# Patient Record
Sex: Male | Born: 1975
Health system: Southern US, Community
[De-identification: ages and names within clinical notes are randomized; demographics above are authoritative.]

## PROBLEM LIST (undated history)

## (undated) DIAGNOSIS — Z86018 Personal history of other benign neoplasm: Secondary | ICD-10-CM

## (undated) HISTORY — DX: Personal history of other benign neoplasm: Z86.018

---

## 2002-06-22 ENCOUNTER — Emergency Department (HOSPITAL_COMMUNITY): Admission: EM | Admit: 2002-06-22 | Discharge: 2002-06-22 | Payer: Self-pay | Admitting: Emergency Medicine

## 2013-05-29 ENCOUNTER — Ambulatory Visit: Payer: Self-pay | Admitting: Internal Medicine

## 2014-09-18 ENCOUNTER — Other Ambulatory Visit: Payer: Self-pay | Admitting: Internal Medicine

## 2014-09-18 DIAGNOSIS — R911 Solitary pulmonary nodule: Secondary | ICD-10-CM

## 2014-09-23 ENCOUNTER — Ambulatory Visit
Admission: RE | Admit: 2014-09-23 | Discharge: 2014-09-23 | Disposition: A | Discharge status: Home / Self Care | Payer: BLUE CROSS/BLUE SHIELD | Source: Ambulatory Visit | Attending: Internal Medicine | Admitting: Internal Medicine

## 2014-09-23 DIAGNOSIS — R911 Solitary pulmonary nodule: Secondary | ICD-10-CM | POA: Diagnosis not present

## 2015-09-09 DIAGNOSIS — E7800 Pure hypercholesterolemia, unspecified: Secondary | ICD-10-CM | POA: Diagnosis not present

## 2015-09-09 DIAGNOSIS — Z79899 Other long term (current) drug therapy: Secondary | ICD-10-CM | POA: Diagnosis not present

## 2015-09-09 DIAGNOSIS — I10 Essential (primary) hypertension: Secondary | ICD-10-CM | POA: Diagnosis not present

## 2015-09-16 DIAGNOSIS — E7800 Pure hypercholesterolemia, unspecified: Secondary | ICD-10-CM | POA: Diagnosis not present

## 2015-09-16 DIAGNOSIS — F419 Anxiety disorder, unspecified: Secondary | ICD-10-CM | POA: Diagnosis not present

## 2015-09-16 DIAGNOSIS — Z0000 Encounter for general adult medical examination without abnormal findings: Secondary | ICD-10-CM | POA: Diagnosis not present

## 2015-09-16 DIAGNOSIS — I10 Essential (primary) hypertension: Secondary | ICD-10-CM | POA: Diagnosis not present

## 2015-12-06 DIAGNOSIS — H52223 Regular astigmatism, bilateral: Secondary | ICD-10-CM | POA: Diagnosis not present

## 2015-12-06 DIAGNOSIS — H5213 Myopia, bilateral: Secondary | ICD-10-CM | POA: Diagnosis not present

## 2016-02-22 DIAGNOSIS — M62830 Muscle spasm of back: Secondary | ICD-10-CM | POA: Diagnosis not present

## 2016-02-22 DIAGNOSIS — M9901 Segmental and somatic dysfunction of cervical region: Secondary | ICD-10-CM | POA: Diagnosis not present

## 2016-02-22 DIAGNOSIS — M4602 Spinal enthesopathy, cervical region: Secondary | ICD-10-CM | POA: Diagnosis not present

## 2016-02-24 DIAGNOSIS — M542 Cervicalgia: Secondary | ICD-10-CM | POA: Diagnosis not present

## 2016-02-24 DIAGNOSIS — M62830 Muscle spasm of back: Secondary | ICD-10-CM | POA: Diagnosis not present

## 2016-02-24 DIAGNOSIS — I10 Essential (primary) hypertension: Secondary | ICD-10-CM | POA: Diagnosis not present

## 2016-02-24 DIAGNOSIS — M9901 Segmental and somatic dysfunction of cervical region: Secondary | ICD-10-CM | POA: Diagnosis not present

## 2016-02-24 DIAGNOSIS — M4602 Spinal enthesopathy, cervical region: Secondary | ICD-10-CM | POA: Diagnosis not present

## 2016-02-28 DIAGNOSIS — M4602 Spinal enthesopathy, cervical region: Secondary | ICD-10-CM | POA: Diagnosis not present

## 2016-02-28 DIAGNOSIS — M62830 Muscle spasm of back: Secondary | ICD-10-CM | POA: Diagnosis not present

## 2016-02-28 DIAGNOSIS — M9901 Segmental and somatic dysfunction of cervical region: Secondary | ICD-10-CM | POA: Diagnosis not present

## 2016-03-08 DIAGNOSIS — Z125 Encounter for screening for malignant neoplasm of prostate: Secondary | ICD-10-CM | POA: Diagnosis not present

## 2016-03-08 DIAGNOSIS — Z79899 Other long term (current) drug therapy: Secondary | ICD-10-CM | POA: Diagnosis not present

## 2016-03-08 DIAGNOSIS — I10 Essential (primary) hypertension: Secondary | ICD-10-CM | POA: Diagnosis not present

## 2016-03-08 DIAGNOSIS — E7800 Pure hypercholesterolemia, unspecified: Secondary | ICD-10-CM | POA: Diagnosis not present

## 2016-03-15 DIAGNOSIS — E7800 Pure hypercholesterolemia, unspecified: Secondary | ICD-10-CM | POA: Diagnosis not present

## 2016-03-15 DIAGNOSIS — F419 Anxiety disorder, unspecified: Secondary | ICD-10-CM | POA: Diagnosis not present

## 2016-03-15 DIAGNOSIS — I10 Essential (primary) hypertension: Secondary | ICD-10-CM | POA: Diagnosis not present

## 2016-03-15 DIAGNOSIS — F3341 Major depressive disorder, recurrent, in partial remission: Secondary | ICD-10-CM | POA: Diagnosis not present

## 2016-06-15 DIAGNOSIS — M542 Cervicalgia: Secondary | ICD-10-CM | POA: Diagnosis not present

## 2016-06-15 DIAGNOSIS — M9901 Segmental and somatic dysfunction of cervical region: Secondary | ICD-10-CM | POA: Diagnosis not present

## 2016-06-15 DIAGNOSIS — M791 Myalgia: Secondary | ICD-10-CM | POA: Diagnosis not present

## 2016-09-12 DIAGNOSIS — Z79899 Other long term (current) drug therapy: Secondary | ICD-10-CM | POA: Diagnosis not present

## 2016-09-12 DIAGNOSIS — I10 Essential (primary) hypertension: Secondary | ICD-10-CM | POA: Diagnosis not present

## 2016-09-12 DIAGNOSIS — E7800 Pure hypercholesterolemia, unspecified: Secondary | ICD-10-CM | POA: Diagnosis not present

## 2016-09-18 DIAGNOSIS — I10 Essential (primary) hypertension: Secondary | ICD-10-CM | POA: Diagnosis not present

## 2016-09-18 DIAGNOSIS — E7800 Pure hypercholesterolemia, unspecified: Secondary | ICD-10-CM | POA: Diagnosis not present

## 2016-09-18 DIAGNOSIS — Z0000 Encounter for general adult medical examination without abnormal findings: Secondary | ICD-10-CM | POA: Diagnosis not present

## 2016-09-18 DIAGNOSIS — F419 Anxiety disorder, unspecified: Secondary | ICD-10-CM | POA: Diagnosis not present

## 2017-01-26 ENCOUNTER — Ambulatory Visit (INDEPENDENT_AMBULATORY_CARE_PROVIDER_SITE_OTHER): Payer: Self-pay | Admitting: Psychiatry

## 2017-01-26 ENCOUNTER — Encounter: Payer: Self-pay | Admitting: Psychiatry

## 2017-01-26 VITALS — BP 137/80 | HR 60 | Temp 97.8°F | Wt 210.0 lb

## 2017-01-26 DIAGNOSIS — J069 Acute upper respiratory infection, unspecified: Secondary | ICD-10-CM

## 2017-01-26 NOTE — Patient Instructions (Signed)
Cool Mist Vaporizer A cool mist vaporizer is a device that releases a cool mist into the air. If you have a cough or a cold, using a vaporizer may help relieve your symptoms. The mist adds moisture to the air, which may help thin your mucus and make it less sticky. When your mucus is thin and less sticky, it easier for you to breathe and to cough up secretions. Do not use a vaporizer if you are allergic to mold. Follow these instructions at home:  Follow the instructions that come with the vaporizer.  Do not use anything other than distilled water in the vaporizer.  Do not run the vaporizer all of the time. Doing that can cause mold or bacteria to grow in the vaporizer.  Clean the vaporizer after each time that you use it.  Clean and dry the vaporizer well before storing it.  Stop using the vaporizer if your breathing symptoms get worse. This information is not intended to replace advice given to you by your health care provider. Make sure you discuss any questions you have with your health care provider. Document Released: 09/23/2003 Document Revised: 07/16/2015 Document Reviewed: 03/27/2015 Elsevier Interactive Patient Education  2018 Elsevier Inc.  

## 2017-01-26 NOTE — Progress Notes (Signed)
  Subjective:     Stanlee Roehrig Prill is a 42 y.o. male who presents for evaluation of symptoms of a URI. Pt reports symptoms of common cold since yesterday, nasal congestion, sneezing, runmig nose. Sore throat lasted only one day, yesterday, but has now resolved. Was around a sick co-worker. Denies any cough, fever, chest pain,  bodyache, nausea or vomiting. Reports wife gave him sudafed and tylenol yesterday and that was effective.  The following portions of the patient's history were reviewed and updated as appropriate: allergies, current medications, past family history, past medical history, past social history, past surgical history and problem list.  Review of Systems A comprehensive review of systems was negative except for: as stated in HPI   Objective:    General appearance: alert Head: Normocephalic, without obvious abnormality, atraumatic Eyes: conjunctivae/corneas clear. PERRL, EOM's intact. Fundi benign. Ears: normal TM's and external ear canals both ears Nose: Nares normal. Septum midline. Mucosa normal. No drainage or sinus tenderness. Throat: lips, mucosa, and tongue normal; teeth and gums normal Neck: no adenopathy, no carotid bruit, no JVD, supple, symmetrical, trachea midline and thyroid not enlarged, symmetric, no tenderness/mass/nodules Lungs: clear to auscultation bilaterally Heart: regular rate and rhythm, S1, S2 normal, no murmur, click, rub or gallop   Assessment:    viral upper respiratory illness   Plan:   Recommended humidifier and OTC allergy medication. Discussed diagnosis and treatment of URI. Suggested symptomatic OTC remedies. Nasal saline spray for congestion. Follow up as needed.

## 2017-03-12 DIAGNOSIS — I10 Essential (primary) hypertension: Secondary | ICD-10-CM | POA: Diagnosis not present

## 2017-03-12 DIAGNOSIS — E7800 Pure hypercholesterolemia, unspecified: Secondary | ICD-10-CM | POA: Diagnosis not present

## 2017-03-12 DIAGNOSIS — Z79899 Other long term (current) drug therapy: Secondary | ICD-10-CM | POA: Diagnosis not present

## 2017-03-12 DIAGNOSIS — Z125 Encounter for screening for malignant neoplasm of prostate: Secondary | ICD-10-CM | POA: Diagnosis not present

## 2017-03-19 DIAGNOSIS — F3341 Major depressive disorder, recurrent, in partial remission: Secondary | ICD-10-CM | POA: Diagnosis not present

## 2017-03-19 DIAGNOSIS — E7800 Pure hypercholesterolemia, unspecified: Secondary | ICD-10-CM | POA: Diagnosis not present

## 2017-03-19 DIAGNOSIS — F410 Panic disorder [episodic paroxysmal anxiety] without agoraphobia: Secondary | ICD-10-CM | POA: Diagnosis not present

## 2017-03-19 DIAGNOSIS — I10 Essential (primary) hypertension: Secondary | ICD-10-CM | POA: Diagnosis not present

## 2017-06-06 DIAGNOSIS — Z86018 Personal history of other benign neoplasm: Secondary | ICD-10-CM

## 2017-06-06 DIAGNOSIS — L578 Other skin changes due to chronic exposure to nonionizing radiation: Secondary | ICD-10-CM | POA: Diagnosis not present

## 2017-06-06 DIAGNOSIS — Z1283 Encounter for screening for malignant neoplasm of skin: Secondary | ICD-10-CM | POA: Diagnosis not present

## 2017-06-06 DIAGNOSIS — D2262 Melanocytic nevi of left upper limb, including shoulder: Secondary | ICD-10-CM | POA: Diagnosis not present

## 2017-06-06 DIAGNOSIS — D229 Melanocytic nevi, unspecified: Secondary | ICD-10-CM | POA: Diagnosis not present

## 2017-06-06 DIAGNOSIS — D485 Neoplasm of uncertain behavior of skin: Secondary | ICD-10-CM | POA: Diagnosis not present

## 2017-06-06 HISTORY — DX: Personal history of other benign neoplasm: Z86.018

## 2017-08-20 DIAGNOSIS — M531 Cervicobrachial syndrome: Secondary | ICD-10-CM | POA: Diagnosis not present

## 2017-08-20 DIAGNOSIS — M9901 Segmental and somatic dysfunction of cervical region: Secondary | ICD-10-CM | POA: Diagnosis not present

## 2017-08-20 DIAGNOSIS — G5601 Carpal tunnel syndrome, right upper limb: Secondary | ICD-10-CM | POA: Diagnosis not present

## 2017-09-11 DIAGNOSIS — H5213 Myopia, bilateral: Secondary | ICD-10-CM | POA: Diagnosis not present

## 2017-09-11 DIAGNOSIS — H52223 Regular astigmatism, bilateral: Secondary | ICD-10-CM | POA: Diagnosis not present

## 2017-09-17 DIAGNOSIS — Z79899 Other long term (current) drug therapy: Secondary | ICD-10-CM | POA: Diagnosis not present

## 2017-09-17 DIAGNOSIS — E7800 Pure hypercholesterolemia, unspecified: Secondary | ICD-10-CM | POA: Diagnosis not present

## 2017-09-24 DIAGNOSIS — Z0000 Encounter for general adult medical examination without abnormal findings: Secondary | ICD-10-CM | POA: Diagnosis not present

## 2017-09-24 DIAGNOSIS — I10 Essential (primary) hypertension: Secondary | ICD-10-CM | POA: Diagnosis not present

## 2017-09-24 DIAGNOSIS — F419 Anxiety disorder, unspecified: Secondary | ICD-10-CM | POA: Diagnosis not present

## 2017-09-24 DIAGNOSIS — E7800 Pure hypercholesterolemia, unspecified: Secondary | ICD-10-CM | POA: Diagnosis not present

## 2017-10-16 DIAGNOSIS — D225 Melanocytic nevi of trunk: Secondary | ICD-10-CM | POA: Diagnosis not present

## 2017-10-16 DIAGNOSIS — Z1283 Encounter for screening for malignant neoplasm of skin: Secondary | ICD-10-CM | POA: Diagnosis not present

## 2017-10-16 DIAGNOSIS — L918 Other hypertrophic disorders of the skin: Secondary | ICD-10-CM | POA: Diagnosis not present

## 2017-10-16 DIAGNOSIS — L814 Other melanin hyperpigmentation: Secondary | ICD-10-CM | POA: Diagnosis not present

## 2017-10-16 DIAGNOSIS — L578 Other skin changes due to chronic exposure to nonionizing radiation: Secondary | ICD-10-CM | POA: Diagnosis not present

## 2017-10-16 DIAGNOSIS — L988 Other specified disorders of the skin and subcutaneous tissue: Secondary | ICD-10-CM | POA: Diagnosis not present

## 2017-10-16 DIAGNOSIS — D485 Neoplasm of uncertain behavior of skin: Secondary | ICD-10-CM | POA: Diagnosis not present

## 2017-10-16 DIAGNOSIS — D1800 Hemangioma unspecified site: Secondary | ICD-10-CM | POA: Diagnosis not present

## 2017-10-23 DIAGNOSIS — D485 Neoplasm of uncertain behavior of skin: Secondary | ICD-10-CM | POA: Diagnosis not present

## 2017-10-23 DIAGNOSIS — Z86018 Personal history of other benign neoplasm: Secondary | ICD-10-CM

## 2017-10-23 DIAGNOSIS — D225 Melanocytic nevi of trunk: Secondary | ICD-10-CM | POA: Diagnosis not present

## 2017-10-23 HISTORY — DX: Personal history of other benign neoplasm: Z86.018

## 2018-03-18 DIAGNOSIS — Z125 Encounter for screening for malignant neoplasm of prostate: Secondary | ICD-10-CM | POA: Diagnosis not present

## 2018-03-18 DIAGNOSIS — E7800 Pure hypercholesterolemia, unspecified: Secondary | ICD-10-CM | POA: Diagnosis not present

## 2018-03-18 DIAGNOSIS — I10 Essential (primary) hypertension: Secondary | ICD-10-CM | POA: Diagnosis not present

## 2018-03-18 DIAGNOSIS — Z79899 Other long term (current) drug therapy: Secondary | ICD-10-CM | POA: Diagnosis not present

## 2018-03-25 DIAGNOSIS — I10 Essential (primary) hypertension: Secondary | ICD-10-CM | POA: Diagnosis not present

## 2018-03-25 DIAGNOSIS — Z79899 Other long term (current) drug therapy: Secondary | ICD-10-CM | POA: Diagnosis not present

## 2018-03-25 DIAGNOSIS — E7800 Pure hypercholesterolemia, unspecified: Secondary | ICD-10-CM | POA: Diagnosis not present

## 2018-03-25 DIAGNOSIS — F419 Anxiety disorder, unspecified: Secondary | ICD-10-CM | POA: Diagnosis not present

## 2018-04-09 MED FILL — SERTRALINE HCL 100 MG TAB: 100 | 90 days supply | Qty: 90 | Fill #0

## 2018-05-11 MED FILL — PRAVASTATIN NA 40 MG TAB: 40 | 90 days supply | Qty: 90 | Fill #0

## 2018-09-02 ENCOUNTER — Other Ambulatory Visit: Payer: Self-pay

## 2018-09-02 DIAGNOSIS — Z20822 Contact with and (suspected) exposure to covid-19: Secondary | ICD-10-CM

## 2018-09-03 LAB — NOVEL CORONAVIRUS, NAA: SARS-CoV-2, NAA: NOT DETECTED

## 2018-09-23 DIAGNOSIS — Z79899 Other long term (current) drug therapy: Secondary | ICD-10-CM | POA: Diagnosis not present

## 2018-09-23 DIAGNOSIS — E7800 Pure hypercholesterolemia, unspecified: Secondary | ICD-10-CM | POA: Diagnosis not present

## 2018-09-30 DIAGNOSIS — E7800 Pure hypercholesterolemia, unspecified: Secondary | ICD-10-CM | POA: Diagnosis not present

## 2018-09-30 DIAGNOSIS — Z23 Encounter for immunization: Secondary | ICD-10-CM | POA: Diagnosis not present

## 2018-09-30 DIAGNOSIS — Z0000 Encounter for general adult medical examination without abnormal findings: Secondary | ICD-10-CM | POA: Diagnosis not present

## 2018-09-30 DIAGNOSIS — I10 Essential (primary) hypertension: Secondary | ICD-10-CM | POA: Diagnosis not present

## 2018-10-14 ENCOUNTER — Other Ambulatory Visit: Payer: Self-pay

## 2018-10-14 DIAGNOSIS — Z20822 Contact with and (suspected) exposure to covid-19: Secondary | ICD-10-CM

## 2018-10-16 LAB — NOVEL CORONAVIRUS, NAA: SARS-CoV-2, NAA: NOT DETECTED

## 2019-03-22 ENCOUNTER — Ambulatory Visit: Payer: Self-pay | Attending: Internal Medicine

## 2019-03-22 DIAGNOSIS — Z23 Encounter for immunization: Secondary | ICD-10-CM

## 2019-03-22 NOTE — Progress Notes (Signed)
   Covid-19 Vaccination Clinic  Name:  Tyler Kennedy    MRN: [id number] DOB: 25-Jul-1975  03/22/2019  [name] was observed post Covid-19 immunization for 15 minutes without incident. He was provided with Vaccine Information Sheet and instruction to access the V-Safe system.   [name] was instructed to call 911 with any severe reactions post vaccine: Marland Kitchen Difficulty breathing  . Swelling of face and throat  . A fast heartbeat  . A bad rash all over body  . Dizziness and weakness   Immunizations Administered    Name Date Dose VIS Date Route   Pfizer COVID-19 Vaccine 03/22/2019  2:05 PM 0.3 mL 12/20/2018 Intramuscular   Manufacturer: [place]   Lot: [id number]   [place]: [id number]

## 2019-03-24 DIAGNOSIS — E7800 Pure hypercholesterolemia, unspecified: Secondary | ICD-10-CM | POA: Diagnosis not present

## 2019-03-24 DIAGNOSIS — I10 Essential (primary) hypertension: Secondary | ICD-10-CM | POA: Diagnosis not present

## 2019-03-24 DIAGNOSIS — Z125 Encounter for screening for malignant neoplasm of prostate: Secondary | ICD-10-CM | POA: Diagnosis not present

## 2019-03-24 DIAGNOSIS — Z79899 Other long term (current) drug therapy: Secondary | ICD-10-CM | POA: Diagnosis not present

## 2019-03-31 ENCOUNTER — Other Ambulatory Visit: Payer: Self-pay | Admitting: Internal Medicine

## 2019-03-31 DIAGNOSIS — F410 Panic disorder [episodic paroxysmal anxiety] without agoraphobia: Secondary | ICD-10-CM | POA: Diagnosis not present

## 2019-03-31 DIAGNOSIS — E7800 Pure hypercholesterolemia, unspecified: Secondary | ICD-10-CM | POA: Diagnosis not present

## 2019-03-31 DIAGNOSIS — F3341 Major depressive disorder, recurrent, in partial remission: Secondary | ICD-10-CM | POA: Diagnosis not present

## 2019-03-31 DIAGNOSIS — I10 Essential (primary) hypertension: Secondary | ICD-10-CM | POA: Diagnosis not present

## 2019-04-16 ENCOUNTER — Ambulatory Visit: Payer: Self-pay | Attending: Internal Medicine

## 2019-04-16 DIAGNOSIS — Z23 Encounter for immunization: Secondary | ICD-10-CM

## 2019-04-16 NOTE — Progress Notes (Signed)
   Covid-19 Vaccination Clinic  Name:  Tyler Kennedy    MRN: [id number] DOB: 04-21-1975  04/16/2019  [name] was observed post Covid-19 immunization for 15 minutes without incident. He was provided with Vaccine Information Sheet and instruction to access the V-Safe system.   [name] was instructed to call 911 with any severe reactions post vaccine: Marland Kitchen Difficulty breathing  . Swelling of face and throat  . A fast heartbeat  . A bad rash all over body  . Dizziness and weakness   Immunizations Administered    Name Date Dose VIS Date Route   Pfizer COVID-19 Vaccine 04/16/2019 11:10 AM 0.3 mL 12/20/2018 Intramuscular   Manufacturer: [place]   Lot: [id number]   [place]: [id number]

## 2019-07-02 DIAGNOSIS — M531 Cervicobrachial syndrome: Secondary | ICD-10-CM | POA: Diagnosis not present

## 2019-07-02 DIAGNOSIS — M9901 Segmental and somatic dysfunction of cervical region: Secondary | ICD-10-CM | POA: Diagnosis not present

## 2019-07-02 DIAGNOSIS — G5601 Carpal tunnel syndrome, right upper limb: Secondary | ICD-10-CM | POA: Diagnosis not present

## 2019-08-05 DIAGNOSIS — Z20822 Contact with and (suspected) exposure to covid-19: Secondary | ICD-10-CM | POA: Diagnosis not present

## 2019-08-28 ENCOUNTER — Encounter: Payer: Self-pay | Admitting: Dermatology

## 2019-09-02 DIAGNOSIS — H5213 Myopia, bilateral: Secondary | ICD-10-CM | POA: Diagnosis not present

## 2019-10-02 ENCOUNTER — Other Ambulatory Visit: Payer: Self-pay

## 2019-10-02 ENCOUNTER — Ambulatory Visit (INDEPENDENT_AMBULATORY_CARE_PROVIDER_SITE_OTHER): Payer: 59 | Admitting: Dermatology

## 2019-10-02 ENCOUNTER — Encounter: Payer: Self-pay | Admitting: Dermatology

## 2019-10-02 DIAGNOSIS — D1800 Hemangioma unspecified site: Secondary | ICD-10-CM

## 2019-10-02 DIAGNOSIS — D229 Melanocytic nevi, unspecified: Secondary | ICD-10-CM

## 2019-10-02 DIAGNOSIS — L821 Other seborrheic keratosis: Secondary | ICD-10-CM

## 2019-10-02 DIAGNOSIS — L578 Other skin changes due to chronic exposure to nonionizing radiation: Secondary | ICD-10-CM

## 2019-10-02 DIAGNOSIS — Z1283 Encounter for screening for malignant neoplasm of skin: Secondary | ICD-10-CM

## 2019-10-02 DIAGNOSIS — L719 Rosacea, unspecified: Secondary | ICD-10-CM | POA: Diagnosis not present

## 2019-10-02 DIAGNOSIS — Z86018 Personal history of other benign neoplasm: Secondary | ICD-10-CM | POA: Diagnosis not present

## 2019-10-02 DIAGNOSIS — L814 Other melanin hyperpigmentation: Secondary | ICD-10-CM

## 2019-10-02 NOTE — Progress Notes (Signed)
   Follow-Up Visit   Subjective  Tyler Kennedy is a 44 y.o. male who presents for the following: Annual Exam (Pt present for UBSE, hx of Dysplastic nevus ).   The patient presents for Upper Body Skin Exam (UBSE) for skin cancer screening and mole check.  The following portions of the chart were reviewed this encounter and updated as appropriate:  Allergies  Meds  Problems  Med Hx  Surg Hx  Fam Hx     Review of Systems:  No other skin or systemic complaints except as noted in HPI or Assessment and Plan.  Objective  Well appearing patient in no apparent distress; mood and affect are within normal limits.  All skin waist up examined.  Objective  Head - Anterior (Face): Mid face erythema with telangiectasias +/- scattered inflammatory papules.   Objective  multiple see history: Scar with no evidence of recurrence.    Assessment & Plan  Rosacea Head - Anterior (Face) Observe   History of dysplastic nevus multiple see history Clear. Observe for recurrence. Call clinic for new or changing lesions.  Recommend regular skin exams, daily broad-spectrum spf 30+ sunscreen use, and photoprotection.      Lentigines - Scattered tan macules - Discussed due to sun exposure - Benign, observe - Call for any changes  Seborrheic Keratoses - Stuck-on, waxy, tan-brown papules and plaques  - Discussed benign etiology and prognosis. - Observe - Call for any changes  Melanocytic Nevi - Tan-brown and/or pink-flesh-colored symmetric macules and papules - Benign appearing on exam today - Observation - Call clinic for new or changing moles - Recommend daily use of broad spectrum spf 30+ sunscreen to sun-exposed areas.   Hemangiomas - Red papules - Discussed benign nature - Observe - Call for any changes  Actinic Damage - diffuse scaly erythematous macules with underlying dyspigmentation - Recommend daily broad spectrum sunscreen SPF 30+ to sun-exposed areas, reapply every 2 hours  as needed.  - Call for new or changing lesions.  Skin cancer screening performed today.  Return in about 1 year (around 10/01/2020) for TBSE.  IMarye Round, CMA, am acting as scribe for Sarina Ser, MD .  Documentation: I have reviewed the above documentation for accuracy and completeness, and I agree with the above.  Sarina Ser, MD

## 2019-10-03 ENCOUNTER — Encounter: Payer: Self-pay | Admitting: Dermatology

## 2019-11-10 DIAGNOSIS — Z79899 Other long term (current) drug therapy: Secondary | ICD-10-CM | POA: Diagnosis not present

## 2019-11-10 DIAGNOSIS — E7800 Pure hypercholesterolemia, unspecified: Secondary | ICD-10-CM | POA: Diagnosis not present

## 2019-11-17 ENCOUNTER — Other Ambulatory Visit: Payer: Self-pay | Admitting: Internal Medicine

## 2019-11-17 DIAGNOSIS — Z0000 Encounter for general adult medical examination without abnormal findings: Secondary | ICD-10-CM | POA: Diagnosis not present

## 2019-11-17 DIAGNOSIS — I10 Essential (primary) hypertension: Secondary | ICD-10-CM | POA: Diagnosis not present

## 2019-11-17 DIAGNOSIS — E7800 Pure hypercholesterolemia, unspecified: Secondary | ICD-10-CM | POA: Diagnosis not present

## 2019-11-17 DIAGNOSIS — Z720 Tobacco use: Secondary | ICD-10-CM | POA: Diagnosis not present

## 2019-11-17 DIAGNOSIS — F419 Anxiety disorder, unspecified: Secondary | ICD-10-CM | POA: Diagnosis not present

## 2020-01-10 ENCOUNTER — Emergency Department
Admission: EM | Admit: 2020-01-10 | Discharge: 2020-01-11 | Disposition: A | Discharge status: Home / Self Care | Payer: 59 | Attending: Emergency Medicine | Admitting: Emergency Medicine

## 2020-01-10 ENCOUNTER — Emergency Department: Payer: 59

## 2020-01-10 ENCOUNTER — Encounter: Payer: Self-pay | Admitting: Emergency Medicine

## 2020-01-10 DIAGNOSIS — S62102A Fracture of unspecified carpal bone, left wrist, initial encounter for closed fracture: Secondary | ICD-10-CM | POA: Diagnosis not present

## 2020-01-10 DIAGNOSIS — S52502A Unspecified fracture of the lower end of left radius, initial encounter for closed fracture: Secondary | ICD-10-CM | POA: Insufficient documentation

## 2020-01-10 DIAGNOSIS — S52612A Displaced fracture of left ulna styloid process, initial encounter for closed fracture: Secondary | ICD-10-CM | POA: Insufficient documentation

## 2020-01-10 DIAGNOSIS — S52592A Other fractures of lower end of left radius, initial encounter for closed fracture: Secondary | ICD-10-CM | POA: Diagnosis not present

## 2020-01-10 DIAGNOSIS — S6992XA Unspecified injury of left wrist, hand and finger(s), initial encounter: Secondary | ICD-10-CM | POA: Diagnosis present

## 2020-01-10 DIAGNOSIS — M7989 Other specified soft tissue disorders: Secondary | ICD-10-CM | POA: Diagnosis not present

## 2020-01-10 DIAGNOSIS — W19XXXA Unspecified fall, initial encounter: Secondary | ICD-10-CM

## 2020-01-10 DIAGNOSIS — S52572A Other intraarticular fracture of lower end of left radius, initial encounter for closed fracture: Secondary | ICD-10-CM | POA: Diagnosis not present

## 2020-01-10 MED ORDER — MORPHINE SULFATE (PF) 4 MG/ML IV SOLN
4.0000 mg | Freq: Once | INTRAVENOUS | Status: AC
  Administered 2020-01-11: 4 mg via INTRAVENOUS
  Filled 2020-01-10: qty 1

## 2020-01-10 MED ORDER — ONDANSETRON HCL 4 MG/2ML IJ SOLN
4.0000 mg | Freq: Once | INTRAMUSCULAR | Status: AC
  Administered 2020-01-11: 4 mg via INTRAVENOUS
  Filled 2020-01-10: qty 2

## 2020-01-10 MED ORDER — OXYCODONE-ACETAMINOPHEN 5-325 MG PO TABS
1.0000 | ORAL_TABLET | Freq: Once | ORAL | Status: AC
  Administered 2020-01-10: 1 via ORAL
  Filled 2020-01-10: qty 1

## 2020-01-10 MED ORDER — LIDOCAINE HCL (PF) 1 % IJ SOLN
10.0000 mL | Freq: Once | INTRAMUSCULAR | Status: AC
  Administered 2020-01-11: 10 mL
  Filled 2020-01-10: qty 10

## 2020-01-10 NOTE — ED Notes (Signed)
Patient informed that he will be transferred to a room on main side. L arm elevated and ice pack placed over top and underneath wrist. Patient and wife state understanding and are agreeable to move rooms.

## 2020-01-10 NOTE — ED Triage Notes (Signed)
Pt reports he fell off a [employer] and caught self with left hand. Obvious deformity to left wrist.

## 2020-01-11 ENCOUNTER — Emergency Department: Payer: 59

## 2020-01-11 DIAGNOSIS — S62102A Fracture of unspecified carpal bone, left wrist, initial encounter for closed fracture: Secondary | ICD-10-CM | POA: Diagnosis not present

## 2020-01-11 DIAGNOSIS — S52502A Unspecified fracture of the lower end of left radius, initial encounter for closed fracture: Secondary | ICD-10-CM | POA: Diagnosis not present

## 2020-01-11 DIAGNOSIS — S52592A Other fractures of lower end of left radius, initial encounter for closed fracture: Secondary | ICD-10-CM | POA: Diagnosis not present

## 2020-01-11 DIAGNOSIS — S52612A Displaced fracture of left ulna styloid process, initial encounter for closed fracture: Secondary | ICD-10-CM | POA: Diagnosis not present

## 2020-01-11 MED ORDER — OXYCODONE-ACETAMINOPHEN 5-325 MG PO TABS: 1.0000 | ORAL_TABLET | ORAL | 0 refills | Status: DC | PRN

## 2020-01-11 MED ORDER — MORPHINE SULFATE (PF) 4 MG/ML IV SOLN
4.0000 mg | Freq: Once | INTRAVENOUS | Status: AC
  Administered 2020-01-11: 4 mg via INTRAVENOUS
  Filled 2020-01-11: qty 1

## 2020-01-11 NOTE — ED Provider Notes (Signed)
[facility] Emergency Department Provider Note  ____________________________________________  Time seen: Approximately 2:05 AM  I have reviewed the triage vital signs and the nursing notes.   HISTORY  Chief Complaint Fall   HPI Tyler Kennedy is a 45 y.o. male left-handed dominant who presents for evaluation of left wrist pain.  Patient reports that he fell off a [employer] and caught himself with the left hand.  Has an obvious deformity to the left wrist.  Is complaining of severe constant throbbing sharp pain since the fall.  Patient denies any other injuries from the fall.   Past Medical History:  Diagnosis Date  . History of dysplastic nevus 06/06/2017   L scapula, moderate to severe atypia, excised 10/16/2017  . Hx of dysplastic nevus 10/23/2017   R mid back lateral 11cm lat to spine, mild atypia  . Hx of dysplastic nevus 10/23/2017   R low back lat flank, 15.0cm lat to spine, mild atypia    There are no problems to display for this patient.   History reviewed. No pertinent surgical history.  Prior to Admission medications   Medication Sig Start Date End Date Taking? Authorizing Provider  oxyCODONE-acetaminophen (PERCOCET) 5-325 MG tablet Take 1 tablet by mouth every 4 (four) hours as needed. 01/11/20  Yes Don Perking, [place], MD  pravastatin (PRAVACHOL) 40 MG tablet TAKE 1 TABLET (40 MG TOTAL) BY MOUTH NIGHTLY. 09/19/14   [provider]  sertraline (ZOLOFT) 100 MG tablet TAKE 1 TABLET BY MOUTH AT BEDTIME 07/09/14   [provider]    Allergies Patient has no known allergies.  History reviewed. No pertinent family history.  Social History Social History   Tobacco Use  . Smoking status: Never Smoker  . Smokeless tobacco: Never Used    Review of Systems  Constitutional: Negative for fever. Eyes: Negative for visual changes. ENT: Negative for facial injury or neck injury Cardiovascular: Negative for chest  injury. Respiratory: Negative for shortness of breath. Negative for chest wall injury. Gastrointestinal: Negative for abdominal pain or injury. Genitourinary: Negative for dysuria. Musculoskeletal: Negative for back injury, + L wrist pain Skin: Negative for laceration/abrasions. Neurological: Negative for head injury.   ____________________________________________   PHYSICAL EXAM:  VITAL SIGNS: ED Triage Vitals  Enc Vitals Group     BP 01/10/20 2155 128/79     Pulse Rate 01/10/20 2155 81     Resp 01/10/20 2155 18     Temp 01/10/20 2155 98.1 F (36.7 C)     Temp Source 01/10/20 2155 Oral     SpO2 01/10/20 2155 98 %     Weight 01/10/20 2158 210 lb (95.3 kg)     Height 01/10/20 2158 5\' 10"  (1.778 m)     Head Circumference --      Peak Flow --      Pain Score 01/10/20 2318 7     Pain Loc --      Pain Edu? --      Excl. in GC? --      Constitutional: Alert and oriented. No acute distress. Does not appear intoxicated. HEENT Head: Normocephalic and atraumatic. Face: No facial bony tenderness.  Eyes: No eye injury. PERRL. No raccoon eyes Nose: Nontender.  Mouth/Throat: Mucous membranes are moist. No oropharyngeal blood. No dental injury. Airway patent without stridor. Normal voice. Neck: no C-collar. No midline c-spine tenderness.  Cardiovascular: Normal rate, regular rhythm. Normal and symmetric distal pulses are present in all extremities. Pulmonary/Chest: Chest wall is stable  and nontender to palpation/compression. Normal respiratory effort. Breath sounds are normal. No crepitus.  Abdominal: Soft, nontender, non distended. Musculoskeletal: Obvious closed deformity of the L wrist. Strong distal pulses and normal neurological exam. Nontender with normal full range of motion in all other extremities. No thoracic or lumbar midline spinal tenderness. Pelvis is stable. Skin: Skin is warm, dry and intact. No abrasions or contutions. Psychiatric: Speech and behavior are  appropriate. Neurological: Normal speech and language. Moves all extremities to command. No gross focal neurologic deficits are appreciated.  Glascow Coma Score: 4 - Opens eyes on own 6 - Follows simple motor commands 5 - Alert and oriented GCS: 15   ____________________________________________   LABS (all labs ordered are listed, but only abnormal results are displayed)  Labs Reviewed - No data to display ____________________________________________  EKG  none  ____________________________________________  RADIOLOGY  I have personally reviewed the images performed during this visit and I agree with the Radiologist's read.   Interpretation by Radiologist:  DG Forearm Left  Result Date: 01/10/2020 CLINICAL DATA:  Fall EXAM: LEFT FOREARM - 2 VIEW COMPARISON:  None. FINDINGS: Acute displaced ulnar styloid process fracture. Acute comminuted intra-articular distal radius fracture with displacement and angulation. Proximal radius and ulna appear intact. No significant elbow effusion. Radial head alignment within normal limits IMPRESSION: 1. Acute comminuted, displaced and angulated intra-articular distal radius fracture. 2. Acute displaced ulnar styloid process fracture. Electronically Signed   By: Donavan Foil M.D.   On: 01/10/2020 22:45   DG Wrist Complete Left  Result Date: 01/11/2020 CLINICAL DATA:  Left wrist fracture, post reduction imaging EXAM: LEFT WRIST - COMPLETE 3+ VIEW COMPARISON:  10:17 p.m. FINDINGS: A a comminuted fracture of the distal radius is again identified. Dominant fracture plane is seen transversely within the metadiaphyseal region with persistentl roughly 6-7 mm override and moderate dorsal angulation of the distal fracture fragment. Longitudinal fracture planes extend into the scaphoid fossa with residual 2 mm gap within the radial articular surface. Unchanged mild loss of radial inclination. Radiocarpal articulation has been preserved. IMPRESSION: Interval  reduction of comminuted left wrist fracture as described above. Electronically Signed   By: Fidela Salisbury MD   On: 01/11/2020 01:57   DG Wrist Complete Left  Result Date: 01/10/2020 CLINICAL DATA:  Fall EXAM: LEFT WRIST - COMPLETE 3+ VIEW COMPARISON:  None. FINDINGS: Acute displaced ulnar styloid process fracture. Acute markedly comminuted intra-articular distal radius fracture with moderate dorsal angulation of distal fracture fragment and close to 1/2 shaft diameter dorsal displacement. Positive for soft tissue swelling IMPRESSION: 1. Acute markedly comminuted intra-articular distal radius fracture with dorsal angulation and displacement. 2. Acute displaced ulnar styloid process fracture. Electronically Signed   By: Donavan Foil M.D.   On: 01/10/2020 22:44   DG Hand Complete Left  Result Date: 01/10/2020 CLINICAL DATA:  Fall EXAM: LEFT HAND - COMPLETE 3+ VIEW COMPARISON:  None. FINDINGS: No acute displaced fracture or malalignment within the hand. Acute comminuted, displaced intra-articular distal radius fracture. IMPRESSION: Acute comminuted, displaced intra-articular distal radius fracture. Electronically Signed   By: Donavan Foil M.D.   On: 01/10/2020 22:43     ____________________________________________   PROCEDURES  Procedure(s) performed:yes .Ortho Injury Treatment  Date/Time: 01/11/2020 2:07 AM Performed by: Rudene Re, MD Authorized by: Rudene Re, MD   Consent:    Consent obtained:  Verbal   Consent given by:  Patient   Risks discussed:  Fracture, nerve damage, restricted joint movement, vascular damage, stiffness, recurrent dislocation and irreducible dislocation  Alternatives discussed:  ImmobilizationInjury location: wrist Location details: left wrist Injury type: fracture-dislocation Pre-procedure neurovascular assessment: neurovascularly intact Pre-procedure distal perfusion: normal Pre-procedure neurological function: normal Pre-procedure range of  motion: reduced Anesthesia: hematoma block  Anesthesia: Local anesthesia used: yes Local Anesthetic: lidocaine 1% without epinephrine Anesthetic total: 10 mL  Patient sedated: NoManipulation performed: yes Skeletal traction used: yes X-ray confirmed reduction: yes Immobilization: splint Splint type: sugar tong Supplies used: Ortho-Glass Post-procedure neurovascular assessment: post-procedure neurovascularly intact Post-procedure distal perfusion: normal Post-procedure neurological function: normal Post-procedure range of motion: unchanged Patient tolerance: patient tolerated the procedure well with no immediate complications    Critical Care performed:  None ____________________________________________   INITIAL IMPRESSION / ASSESSMENT AND PLAN / ED COURSE   45 y.o. male left-handed dominant who presents for evaluation of left wrist pain after falling off a [employer].  Patient with close deformity of the left wrist.  X-ray visualized by me showing a comminuted distal radius fracture and ulnar styloid fracture, confirmed by radiology. Hematoma block was done and limb was hung with finger traps for 40 min with the assistance of weights to help with reduction. Manipulation showed an impacted fracture. Minimal improvement on post reduction film. No signs of compartment syndrome or neurovascular involvement. Patient placed on splint and will be referred to ortho for close follow up. Message sent to Dr. Roland Rack for expedite follow up. Discussed signs and symptoms of compartment syndrome and recommended return if these develop.  Also discussed home care of splint.  History gathered from patient and his wife was at bedside, plan discussed with both of them.    ____________________________________________  Please note:  Patient was evaluated in Emergency Department today for the symptoms described in the history of present illness. Patient was evaluated in the context of the global COVID-19  pandemic, which necessitated consideration that the patient might be at risk for infection with the SARS-CoV-2 virus that causes COVID-19. Institutional protocols and algorithms that pertain to the evaluation of patients at risk for COVID-19 are in a state of rapid change based on information released by regulatory bodies including the CDC and federal and state organizations. These policies and algorithms were followed during the patient's care in the ED.  Some ED evaluations and interventions may be delayed as a result of limited staffing during the pandemic.   ____________________________________________   FINAL CLINICAL IMPRESSION(S) / ED DIAGNOSES   Final diagnoses:  Fall, initial encounter  Closed fracture of distal end of left radius, unspecified fracture morphology, initial encounter  Closed displaced fracture of styloid process of left ulna, initial encounter      NEW MEDICATIONS STARTED DURING THIS VISIT:  ED Discharge Orders         Ordered    oxyCODONE-acetaminophen (PERCOCET) 5-325 MG tablet  Every 4 hours PRN        01/11/20 0217           Note:  This document was prepared using Dragon voice recognition software and may include unintentional dictation errors.    Alfred Levins, [place], MD 01/11/20 [phone]

## 2020-01-12 ENCOUNTER — Other Ambulatory Visit: Payer: Self-pay | Admitting: Surgery

## 2020-01-12 ENCOUNTER — Other Ambulatory Visit
Admission: RE | Admit: 2020-01-12 | Discharge: 2020-01-12 | Disposition: A | Discharge status: Home / Self Care | Payer: 59 | Source: Ambulatory Visit | Attending: Surgery | Admitting: Surgery

## 2020-01-12 DIAGNOSIS — Z01812 Encounter for preprocedural laboratory examination: Secondary | ICD-10-CM | POA: Insufficient documentation

## 2020-01-12 DIAGNOSIS — Z20822 Contact with and (suspected) exposure to covid-19: Secondary | ICD-10-CM | POA: Diagnosis not present

## 2020-01-12 DIAGNOSIS — S52532A Colles' fracture of left radius, initial encounter for closed fracture: Secondary | ICD-10-CM | POA: Diagnosis not present

## 2020-01-13 ENCOUNTER — Encounter
Admission: RE | Disposition: A | Discharge status: Home / Self Care | Payer: Self-pay | Source: Home / Self Care | Attending: Surgery

## 2020-01-13 ENCOUNTER — Ambulatory Visit: Payer: 59 | Admitting: Certified Registered Nurse Anesthetist

## 2020-01-13 ENCOUNTER — Ambulatory Visit
Admission: RE | Admit: 2020-01-13 | Discharge: 2020-01-13 | Disposition: A | Discharge status: Home / Self Care | Payer: 59 | Attending: Surgery | Admitting: Surgery

## 2020-01-13 ENCOUNTER — Other Ambulatory Visit: Payer: Self-pay | Admitting: Surgery

## 2020-01-13 ENCOUNTER — Encounter: Payer: Self-pay | Admitting: Surgery

## 2020-01-13 ENCOUNTER — Other Ambulatory Visit: Payer: Self-pay

## 2020-01-13 ENCOUNTER — Ambulatory Visit: Payer: 59

## 2020-01-13 DIAGNOSIS — Z79899 Other long term (current) drug therapy: Secondary | ICD-10-CM | POA: Diagnosis not present

## 2020-01-13 DIAGNOSIS — S52572A Other intraarticular fracture of lower end of left radius, initial encounter for closed fracture: Secondary | ICD-10-CM | POA: Diagnosis not present

## 2020-01-13 DIAGNOSIS — Z87891 Personal history of nicotine dependence: Secondary | ICD-10-CM | POA: Diagnosis not present

## 2020-01-13 DIAGNOSIS — S52532A Colles' fracture of left radius, initial encounter for closed fracture: Secondary | ICD-10-CM | POA: Diagnosis not present

## 2020-01-13 HISTORY — PX: ORIF WRIST FRACTURE: SHX2133

## 2020-01-13 LAB — SARS CORONAVIRUS 2 (TAT 6-24 HRS): SARS Coronavirus 2: NEGATIVE

## 2020-01-13 SURGERY — OPEN REDUCTION INTERNAL FIXATION (ORIF) WRIST FRACTURE
Anesthesia: Choice | Site: Wrist | Laterality: Left

## 2020-01-13 MED ORDER — LIDOCAINE HCL (PF) 2 % IJ SOLN
INTRAMUSCULAR | Status: AC
  Filled 2020-01-13: qty 5

## 2020-01-13 MED ORDER — FENTANYL CITRATE (PF) 100 MCG/2ML IJ SOLN
INTRAMUSCULAR | Status: AC
  Filled 2020-01-13: qty 2

## 2020-01-13 MED ORDER — ACETAMINOPHEN 160 MG/5ML PO SOLN
325.0000 mg | ORAL | Status: DC | PRN
Start: 2020-01-13 — End: 2020-01-13
  Filled 2020-01-13: qty 20.3

## 2020-01-13 MED ORDER — PROPOFOL 10 MG/ML IV BOLUS
INTRAVENOUS | Status: AC
  Filled 2020-01-13: qty 40

## 2020-01-13 MED ORDER — KETOROLAC TROMETHAMINE 30 MG/ML IJ SOLN
INTRAMUSCULAR | Status: DC | PRN
  Administered 2020-01-13: 30 mg via INTRAVENOUS

## 2020-01-13 MED ORDER — MIDAZOLAM HCL 2 MG/2ML IJ SOLN
INTRAMUSCULAR | Status: DC | PRN
  Administered 2020-01-13: 2 mg via INTRAVENOUS

## 2020-01-13 MED ORDER — CHLORHEXIDINE GLUCONATE 0.12 % MT SOLN
OROMUCOSAL | Status: AC
  Administered 2020-01-13: 15 mL via OROMUCOSAL
  Filled 2020-01-13: qty 15

## 2020-01-13 MED ORDER — CHLORHEXIDINE GLUCONATE 0.12 % MT SOLN: 15.0000 mL | Freq: Once | OROMUCOSAL | Status: AC

## 2020-01-13 MED ORDER — OXYCODONE HCL 5 MG PO TABS: 5.0000 mg | ORAL_TABLET | ORAL | 0 refills | Status: DC | PRN

## 2020-01-13 MED ORDER — FENTANYL CITRATE (PF) 100 MCG/2ML IJ SOLN
INTRAMUSCULAR | Status: DC | PRN
  Administered 2020-01-13: 50 ug via INTRAVENOUS
  Administered 2020-01-13: 25 ug via INTRAVENOUS
  Administered 2020-01-13 (×2): 50 ug via INTRAVENOUS
  Administered 2020-01-13 (×2): 25 ug via INTRAVENOUS
  Administered 2020-01-13: 50 ug via INTRAVENOUS
  Administered 2020-01-13: 25 ug via INTRAVENOUS

## 2020-01-13 MED ORDER — HYDROCODONE-ACETAMINOPHEN 7.5-325 MG PO TABS
1.0000 | ORAL_TABLET | Freq: Once | ORAL | Status: AC | PRN
  Administered 2020-01-13: 1 via ORAL

## 2020-01-13 MED ORDER — KETOROLAC TROMETHAMINE 30 MG/ML IJ SOLN: 30.0000 mg | Freq: Once | INTRAMUSCULAR | Status: DC | PRN

## 2020-01-13 MED ORDER — DEXAMETHASONE SODIUM PHOSPHATE 10 MG/ML IJ SOLN
INTRAMUSCULAR | Status: DC | PRN
  Administered 2020-01-13: 8 mg via INTRAVENOUS

## 2020-01-13 MED ORDER — BUPIVACAINE HCL (PF) 0.5 % IJ SOLN
INTRAMUSCULAR | Status: AC
  Filled 2020-01-13: qty 30

## 2020-01-13 MED ORDER — LACTATED RINGERS IV SOLN: INTRAVENOUS | Status: DC

## 2020-01-13 MED ORDER — ACETAMINOPHEN 10 MG/ML IV SOLN
INTRAVENOUS | Status: DC | PRN
  Administered 2020-01-13: 1000 mg via INTRAVENOUS

## 2020-01-13 MED ORDER — ONDANSETRON HCL 4 MG/2ML IJ SOLN
INTRAMUSCULAR | Status: AC
  Filled 2020-01-13: qty 2

## 2020-01-13 MED ORDER — FENTANYL CITRATE (PF) 100 MCG/2ML IJ SOLN: 25.0000 ug | INTRAMUSCULAR | Status: DC | PRN

## 2020-01-13 MED ORDER — CEFAZOLIN SODIUM-DEXTROSE 2-4 GM/100ML-% IV SOLN
2.0000 g | INTRAVENOUS | Status: AC
  Administered 2020-01-13: 2 g via INTRAVENOUS

## 2020-01-13 MED ORDER — CEFAZOLIN SODIUM-DEXTROSE 2-4 GM/100ML-% IV SOLN
INTRAVENOUS | Status: AC
  Filled 2020-01-13: qty 100

## 2020-01-13 MED ORDER — BUPIVACAINE HCL (PF) 0.5 % IJ SOLN
INTRAMUSCULAR | Status: DC | PRN
  Administered 2020-01-13: 20 mL

## 2020-01-13 MED ORDER — PROPOFOL 10 MG/ML IV BOLUS
INTRAVENOUS | Status: DC | PRN
  Administered 2020-01-13: 50 mg via INTRAVENOUS
  Administered 2020-01-13: 200 mg via INTRAVENOUS

## 2020-01-13 MED ORDER — DEXAMETHASONE SODIUM PHOSPHATE 10 MG/ML IJ SOLN
INTRAMUSCULAR | Status: AC
  Filled 2020-01-13: qty 1

## 2020-01-13 MED ORDER — ONDANSETRON HCL 4 MG/2ML IJ SOLN
INTRAMUSCULAR | Status: DC | PRN
  Administered 2020-01-13: 4 mg via INTRAVENOUS

## 2020-01-13 MED ORDER — HYDROCODONE-ACETAMINOPHEN 7.5-325 MG PO TABS
ORAL_TABLET | ORAL | Status: AC
  Filled 2020-01-13: qty 1

## 2020-01-13 MED ORDER — ACETAMINOPHEN 10 MG/ML IV SOLN
INTRAVENOUS | Status: AC
  Filled 2020-01-13: qty 100

## 2020-01-13 MED ORDER — ORAL CARE MOUTH RINSE: 15.0000 mL | Freq: Once | OROMUCOSAL | Status: AC

## 2020-01-13 MED ORDER — MIDAZOLAM HCL 2 MG/2ML IJ SOLN
INTRAMUSCULAR | Status: AC
  Filled 2020-01-13: qty 2

## 2020-01-13 MED ORDER — PROMETHAZINE HCL 25 MG/ML IJ SOLN: 6.2500 mg | INTRAMUSCULAR | Status: DC | PRN

## 2020-01-13 MED ORDER — ACETAMINOPHEN 325 MG PO TABS: 325.0000 mg | ORAL_TABLET | ORAL | Status: DC | PRN

## 2020-01-13 MED ORDER — LIDOCAINE HCL (CARDIAC) PF 100 MG/5ML IV SOSY
PREFILLED_SYRINGE | INTRAVENOUS | Status: DC | PRN
  Administered 2020-01-13: 80 mg via INTRAVENOUS

## 2020-01-13 SURGICAL SUPPLY — 63 items
APL PRP STRL LF DISP 70% ISPRP (MISCELLANEOUS) ×2
BIT DRILL 2.2 SS TIBIAL (BIT) ×1 IMPLANT
BNDG COHESIVE 4X5 TAN STRL (GAUZE/BANDAGES/DRESSINGS) ×2 IMPLANT
BNDG ELASTIC 4X5.8 VLCR STR LF (GAUZE/BANDAGES/DRESSINGS) ×2 IMPLANT
BNDG ESMARK 4X12 TAN STRL LF (GAUZE/BANDAGES/DRESSINGS) ×2 IMPLANT
CANISTER SUCT 1200ML W/VALVE (MISCELLANEOUS) ×2 IMPLANT
CHLORAPREP W/TINT 26 (MISCELLANEOUS) ×4 IMPLANT
CORD BIP STRL DISP 12FT (MISCELLANEOUS) ×2 IMPLANT
COVER WAND RF STERILE (DRAPES) ×2 IMPLANT
CUFF TOURN SGL QUICK 18X4 (TOURNIQUET CUFF) ×1 IMPLANT
DRAPE FLUOR MINI C-ARM 54X84 (DRAPES) ×2 IMPLANT
DRAPE SPLIT 6X30 W/TAPE (DRAPES) ×2 IMPLANT
DRAPE SURG 17X11 SM STRL (DRAPES) ×2 IMPLANT
DRAPE U-SHAPE 47X51 STRL (DRAPES) ×2 IMPLANT
ELECT CAUTERY BLADE 6.4 (BLADE) ×2 IMPLANT
ELECT REM PT RETURN 9FT ADLT (ELECTROSURGICAL) ×2
ELECTRODE REM PT RTRN 9FT ADLT (ELECTROSURGICAL) ×1 IMPLANT
FORCEPS JEWEL BIP 4-3/4 STR (INSTRUMENTS) ×2 IMPLANT
GAUZE SPONGE 4X4 12PLY STRL (GAUZE/BANDAGES/DRESSINGS) ×2 IMPLANT
GAUZE XEROFORM 1X8 LF (GAUZE/BANDAGES/DRESSINGS) ×2 IMPLANT
GLOVE BIO SURGEON STRL SZ8 (GLOVE) ×2 IMPLANT
GLOVE INDICATOR 8.0 STRL GRN (GLOVE) ×2 IMPLANT
GOWN STRL REUS W/ TWL LRG LVL3 (GOWN DISPOSABLE) ×1 IMPLANT
GOWN STRL REUS W/ TWL XL LVL3 (GOWN DISPOSABLE) ×1 IMPLANT
GOWN STRL REUS W/TWL LRG LVL3 (GOWN DISPOSABLE) ×2
GOWN STRL REUS W/TWL XL LVL3 (GOWN DISPOSABLE) ×2
K-WIRE 1.6 (WIRE) ×4
K-WIRE FX5X1.6XNS BN SS (WIRE) ×2
KIT TURNOVER KIT A (KITS) ×2 IMPLANT
KWIRE FX5X1.6XNS BN SS (WIRE) IMPLANT
MANIFOLD NEPTUNE II (INSTRUMENTS) ×2 IMPLANT
NDL FILTER BLUNT 18X1 1/2 (NEEDLE) ×1 IMPLANT
NEEDLE FILTER BLUNT 18X 1/2SAF (NEEDLE) ×1
NEEDLE FILTER BLUNT 18X1 1/2 (NEEDLE) ×1 IMPLANT
NS IRRIG 500ML POUR BTL (IV SOLUTION) ×2 IMPLANT
PACK EXTREMITY ARMC (MISCELLANEOUS) ×2 IMPLANT
PADDING CAST 4IN STRL (MISCELLANEOUS) ×1
PADDING CAST BLEND 4X4 STRL (MISCELLANEOUS) ×2 IMPLANT
PEG LOCKING SMOOTH 2.2X16 (Screw) ×1 IMPLANT
PEG LOCKING SMOOTH 2.2X18 (Peg) ×3 IMPLANT
PEG LOCKING SMOOTH 2.2X20 (Screw) ×2 IMPLANT
PLATE STANDARD DVR LEFT (Plate) ×2 IMPLANT
PLATE STD DVR LT 24X51 (Plate) IMPLANT
SCREW  LP NL 2.7X16MM (Screw) ×2 IMPLANT
SCREW LOCK 16X2.7X 3 LD TPR (Screw) IMPLANT
SCREW LOCK 18X2.7X 3 LD TPR (Screw) IMPLANT
SCREW LOCKING 2.7X15MM (Screw) ×1 IMPLANT
SCREW LOCKING 2.7X16 (Screw) ×2 IMPLANT
SCREW LOCKING 2.7X18 (Screw) ×2 IMPLANT
SCREW LP NL 2.7X16MM (Screw) IMPLANT
SCREW NONLOCK 2.7X20MM (Screw) ×1 IMPLANT
SPLINT CAST 1 STEP 3X12 (MISCELLANEOUS) IMPLANT
SPLINT CAST 1 STEP 4X15 (MISCELLANEOUS) IMPLANT
STAPLER SKIN PROX 35W (STAPLE) ×2 IMPLANT
STOCKINETTE IMPERVIOUS 9X36 MD (GAUZE/BANDAGES/DRESSINGS) ×2 IMPLANT
STRIP CLOSURE SKIN 1/4X4 (GAUZE/BANDAGES/DRESSINGS) IMPLANT
SUT PROLENE 4 0 PS 2 18 (SUTURE) ×2 IMPLANT
SUT VIC AB 2-0 SH 27 (SUTURE) ×2
SUT VIC AB 2-0 SH 27XBRD (SUTURE) ×1 IMPLANT
SUT VIC AB 3-0 SH 27 (SUTURE) ×2
SUT VIC AB 3-0 SH 27X BRD (SUTURE) ×1 IMPLANT
SWABSTK COMLB BENZOIN TINCTURE (MISCELLANEOUS) ×1 IMPLANT
SYR 10ML LL (SYRINGE) ×2 IMPLANT

## 2020-01-13 NOTE — Anesthesia Preprocedure Evaluation (Addendum)
Anesthesia Evaluation  Patient identified by MRN, date of birth, ID band Patient awake    Reviewed: Allergy & Precautions, H&P , NPO status , Patient's Chart, lab work & pertinent test results, reviewed documented beta blocker date and time   Airway Mallampati: II  TM Distance: >3 FB Neck ROM: full    Dental  (+) Teeth Intact   Pulmonary neg pulmonary ROS,    Pulmonary exam normal        Cardiovascular Exercise Tolerance: Good negative cardio ROS Normal cardiovascular exam Rate:Normal     Neuro/Psych negative neurological ROS  negative psych ROS   GI/Hepatic negative GI ROS, Neg liver ROS,   Endo/Other  negative endocrine ROS  Renal/GU negative Renal ROS  negative genitourinary   Musculoskeletal   Abdominal   Peds  Hematology negative hematology ROS (+)   Anesthesia Other Findings   Reproductive/Obstetrics negative OB ROS                             Anesthesia Physical Anesthesia Plan  ASA: II  Anesthesia Plan: General LMA   Post-op Pain Management:    Induction:   PONV Risk Score and Plan:   Airway Management Planned:   Additional Equipment:   Intra-op Plan:   Post-operative Plan:   Informed Consent: I have reviewed the patients History and Physical, chart, labs and discussed the procedure including the risks, benefits and alternatives for the proposed anesthesia with the patient or authorized representative who has indicated his/her understanding and acceptance.       Plan Discussed with: CRNA  Anesthesia Plan Comments:         Anesthesia Quick Evaluation

## 2020-01-13 NOTE — Anesthesia Postprocedure Evaluation (Signed)
Anesthesia Post Note  Patient: Tyler Kennedy  Procedure(s) Performed: OPEN REDUCTION INTERNAL FIXATION (ORIF), left distal radius (Left Wrist)  Patient location during evaluation: PACU Anesthesia Type: General Level of consciousness: awake and alert Pain management: pain level controlled Vital Signs Assessment: post-procedure vital signs reviewed and stable Respiratory status: spontaneous breathing, nonlabored ventilation and respiratory function stable Cardiovascular status: blood pressure returned to baseline and stable Postop Assessment: no apparent nausea or vomiting Anesthetic complications: no   No complications documented.   Last Vitals:  Vitals:   01/13/20 1624 01/13/20 1635  BP: (!) 123/99 (!) 153/91  Pulse: (!) 101 (!) 108  Resp: 14 18  Temp: (!) 36.4 C 36.8 C  SpO2: 97% 98%    Last Pain:  Vitals:   01/13/20 1635  TempSrc: Oral  PainSc: 2                  Karleen Hampshire

## 2020-01-13 NOTE — H&P (Signed)
History of Present Illness:  Tyler Kennedy is a 45 y.o. male who presents for evaluation and treatment of his left wrist injury. The patient sustained this injury to his left wrist 2 days ago when he apparently fell off a hover board and landed on his outstretched left hand. He went to the emergency room where x-rays demonstrated a displaced comminuted intra-articular distal radius fracture. An attempt at closed reduction was performed by the ER provider with limited benefit. The patient was splinted and advised to follow-up with orthopedics for discussion of further surgical intervention. The patient notes moderate pain in the wrist which he rates at 4/10 and for which he has been taking oxycodone and ibuprofen as necessary with temporary partial relief. He denies any numbness or paresthesias to his fingers, and denies any prior injury to the left wrist or hand.  Current Outpatient Medications: . pravastatin (PRAVACHOL) 40 MG tablet Take 1 tablet (40 mg total) by mouth once daily 90 tablet 3  . propranoloL (INDERAL) 10 MG tablet Take 1 tablet (10 mg total) by mouth 2 (two) times daily 180 tablet 3  . sertraline (ZOLOFT) 100 MG tablet Take 1 tablet (100 mg total) by mouth once daily 90 tablet 3   Allergies: No Known Allergies  Past Medical History:  . Essential hypertension, benign  . Other and unspecified hyperlipidemia  . Panic disorder   Past Surgical History:  Procedure Laterality Date  . Wisdom Teeth Removed   Family History:  . Hyperlipidemia (Elevated cholesterol) Mother  . High blood pressure (Hypertension) Mother  . Diabetes type II Father  . High blood pressure (Hypertension) Father   Social History:   Socioeconomic History:  Marland Kitchen Marital status: Married  Spouse name: Not on file  . Number of children: Not on file  . Years of education: Not on file  . Highest education level: Not on file  Occupational History  . Not on file  Tobacco Use  . Smoking status: Former Smoker   Packs/day: 1.00  Quit date: 01/03/2016  Years since quitting: 4.0  . Smokeless tobacco: Never Used  Substance and Sexual Activity  . Alcohol use: No  . Drug use: No  . Sexual activity: Not on file  Other Topics Concern  . Not on file  Social History Narrative  . Not on file   Social Determinants of Health:   Financial Resource Strain: Not on file  Food Insecurity: Not on file  Transportation Needs: Not on file   Review of Systems:  A comprehensive 14 point ROS was performed, reviewed, and the pertinent orthopaedic findings are documented in the HPI.  Physical Exam: Vitals:  01/12/20 0951  BP: 128/79  Weight: 95.3 kg (210 lb)  Height: 177.8 cm (5\' 10" )  PainSc: 4  PainLoc: Wrist   General/Constitutional: The patient appears to be well-nourished, well-developed, and in no acute distress. Neuro/Psych: Normal mood and affect, oriented to person, place and time. Eyes: Non-icteric. Pupils are equal, round, and reactive to light, and exhibit synchronous movement. ENT: Unremarkable. Lymphatic: No palpable adenopathy. Respiratory: Lungs clear to auscultation, Normal chest excursion, No wheezes and Non-labored breathing Cardiovascular: Regular rate and rhythm. No murmurs. and No edema, swelling or tenderness, except as noted in detailed exam. Integumentary: No impressive skin lesions present, except as noted in detailed exam. Musculoskeletal: Unremarkable, except as noted in detailed exam.  Left hand/wrist exam: The patient is in a sugar tong splint maintaining the wrist in neutral position. The splint appears to be in satisfactory condition  and is fitting appropriately. His skin is intact at the proximal distal margins of the splint. There is mild swelling of all digits, but no erythema, ecchymosis, abrasions, or other skin abnormalities are identified. He is able active flex and extend all digits without any pain or triggering. He is neurovascularly intact to the tips of all  digits.  X-rays/MRI/Lab data:  Recent outside x-rays of the left wrist are available for review and have been reviewed by myself. These films demonstrate a dorsally angulated/displaced left distal radius fracture with intra-articular extension. No significant degenerative changes or lytic lesions are identified.  Assessment: Closed Colles' fracture of left radius.   Plan: The treatment options were discussed with the patient and his wife. In addition, patient educational materials were provided regarding the diagnosis and treatment options. The patient is frustrated by his symptoms and function limitations. Given that he is left-handed and still relatively young, I feel that he be best managed with more aggressive treatment. Therefore, I have recommended a surgical procedure, specifically an open reduction and internal fixation of his left distal radius fracture. The procedure was discussed with the patient, as were the potential risks (including bleeding, infection, nerve and/or blood vessel injury, persistent or recurrent pain, stiffness, malunion, non-union, post-operative degenerative changes, need for further surgery, blood clots, strokes, heart attacks and/or arhythmias, pneumonia, etc.) and benefits. The patient states his/her understanding and wishes to proceed. All of the patient's questions and concerns were answered. He can call any time with further concerns. He will follow up post-surgery, routine.   H&P reviewed and patient re-examined. No changes.

## 2020-01-13 NOTE — Transfer of Care (Signed)
Immediate Anesthesia Transfer of Care Note  Patient: Tyler Kennedy  Procedure(s) Performed: OPEN REDUCTION INTERNAL FIXATION (ORIF), left distal radius (Left Wrist)  Patient Location: PACU  Anesthesia Type:General  Level of Consciousness: drowsy and patient cooperative  Airway & Oxygen Therapy: Patient Spontanous Breathing and Patient connected to face mask oxygen  Post-op Assessment: Report given to RN and Post -op Vital signs reviewed and stable  Post vital signs: Reviewed and stable  Last Vitals:  Vitals Value Taken Time  BP 127/81 01/13/20 1540  Temp 36.6 C 01/13/20 1540  Pulse 80 01/13/20 1544  Resp 13 01/13/20 1544  SpO2 96 % 01/13/20 1544  Vitals shown include unvalidated device data.  Last Pain:  Vitals:   01/13/20 1540  TempSrc:   PainSc: Asleep         Complications: No complications documented.

## 2020-01-13 NOTE — Anesthesia Procedure Notes (Signed)
Procedure Name: LMA Insertion Date/Time: 01/13/2020 1:35 PM Performed by: Henrietta Hoover, CRNA Pre-anesthesia Checklist: Emergency Drugs available, Patient identified, Suction available and Patient being monitored Patient Re-evaluated:Patient Re-evaluated prior to induction Oxygen Delivery Method: Circle system utilized Preoxygenation: Pre-oxygenation with 100% oxygen Induction Type: IV induction Ventilation: Mask ventilation without difficulty LMA: LMA inserted LMA Size: 4.0 Number of attempts: 1 Placement Confirmation: positive ETCO2 and breath sounds checked- equal and bilateral Tube secured with: Tape Dental Injury: Teeth and Oropharynx as per pre-operative assessment

## 2020-01-13 NOTE — Op Note (Signed)
01/13/2020  3:38 PM  Patient:   Tyler Kennedy  Pre-Op Diagnosis:   Closed acute displaced intra-articular distal radius fracture, left wrist.  Post-Op Diagnosis:   Same.  Procedure:   Open reduction and internal fixation of left distal radius fracture.  Surgeon:   Maryagnes Amos, MD  Assistant:   Justice Britain, PA-S  Anesthesia:   General LMA  Findings:   As above.  Complications:   None  EBL:   5 cc  Fluids:   800 cc crystalloid  TT:   86 minutes at 250 mmHg  Drains:   None  Closure:   3-0 Vicryl subcuticular sutures  Implants:   Biomet DVR Cross-locked narrow precontoured distal radius mini-locking plate.  Brief Clinical Note:   The patient is a 45 year old male who sustained above-noted injury several days ago when he apparently fell onto his outstretched left hand while trying to ride a hover board. He was brought to the emergency room where x-rays demonstrated the above-noted injury. He presents at this time for definitive management of her injury.  Procedure:   The patient was brought into the operating room and lain in the supine position. After adequate general laryngal mask anesthesia was obtained, the patient's left hand and upper extremity were prepped with ChloraPrep solution before being draped sterilely. Preoperative antibiotics were administered. A timeout was performed to verify the appropriate surgical site before the limb was exsanguinated with an Esmarch and the tourniquet inflated to 250 mmHg.   An approximately 7-8 cm incision was made over the volar aspect of the distal radius beginning at the volar flexion crease and extending proximally along the flexor carpi radialis tendon. The incision was carried down through the subcutaneous tissues to expose the superficial retinaculum. This was split the length of the incision directly over the flexor carpi radialis tendon. The FCR sheath was opened and the tendon retracted ulnarly to protect the median nerve. The  floor of the FCR sheath was opened to expose the pronator quadratus muscle. This was released along the radial insertion and the muscle was retracted ulnarly to expose the distal radius. The fracture was identified and soft tissues elevated off the distal metaphyseal region for several centimeters. The fracture was carefully reduced and held in place while the appropriate sized plate was positioned carefully on the distal radius. A guidewire was placed through the distal hole and its position verified using FluoroScan imaging in AP and lateral projections. After several attempts, the pin was positioned parallel to the distal articular surface and approximately 3-4 mm proximal to the articular surface. The proximal portion of the plate was stabilized using a nonlocking bicortical screw placed in the slotted hole to permit slight adjustments of the plate. The position of the plate was verified using FluoroScan imaging in AP and lateral projections and found to be excellent.   Distally, a nonlocking cortical screw was placed in one of the more distal holes to stabilize the more radial portion of the distal radius while a locking cortical screw was placed more ulnarly to stabilize the ulnar fragment. The other holes were filled with locking pegs of the appropriate lengths. The adequacy of hardware position and fracture reduction was verified using FluoroScan imaging in AP, lateral, and several additional oblique projections to be sure that the hardware did not enter the joint nor did it penetrate dorsally. Two additional locking cortical screws were placed proximally to secure the plate to the metaphyseal region. Again the construct was assessed using FluoroScan  imaging in AP, lateral, and oblique projections and found to be excellent.  The wound was copiously irrigated with sterile saline solution before the pronator quadratus was reapproximated using 2-0 Vicryl interrupted sutures. The subcutaneous tissues were  closed using 2-0 Vicryl interrupted sutures before the subcuticular layer was closed using 3-0 Vicryl inverted interrupted sutures. Benzoin and Steri-Strips are applied to the skin. A total of 20 cc of 0.5% plain Sensorcaine was injected in and around the incision site to help with postoperative analgesia before a sterile bulky dressing was applied to the wound. The patient was placed into a volar splint maintaining the wrist in neutral position before the patient was awakened, extubated, and returned to the recovery room in satisfactory condition after tolerating the procedure well.

## 2020-01-13 NOTE — Discharge Instructions (Addendum)
Orthopedic discharge instructions: Keep splint dry and intact. Keep hand elevated above heart level. Apply ice to affected area frequently. Take ibuprofen 600-800 mg TID with meals for 7-10 days, then as necessary. Take pain medication as prescribed or ES Tylenol when needed.  Return for follow-up in 10-14 days or as scheduled.  AMBULATORY SURGERY  DISCHARGE INSTRUCTIONS   1) The drugs that you were given will stay in your system until tomorrow so for the next 24 hours you should not:  A) Drive an automobile B) Make any legal decisions C) Drink any alcoholic beverage   2) You may resume regular meals tomorrow.  Today it is better to start with liquids and gradually work up to solid foods.  You may eat anything you prefer, but it is better to start with liquids, then soup and crackers, and gradually work up to solid foods.   3) Please notify your doctor immediately if you have any unusual bleeding, trouble breathing, redness and pain at the surgery site, drainage, fever, or pain not relieved by medication.    4) Additional Instructions:        Please contact your physician with any problems or Same Day Surgery at [phone], Monday through Friday 6 am to 4 pm, or [facility] number at [phone]. 

## 2020-01-14 ENCOUNTER — Encounter: Payer: Self-pay | Admitting: Surgery

## 2020-01-23 ENCOUNTER — Ambulatory Visit: Payer: 59 | Attending: Internal Medicine

## 2020-01-23 ENCOUNTER — Ambulatory Visit: Payer: 59

## 2020-01-23 ENCOUNTER — Other Ambulatory Visit: Payer: Self-pay | Admitting: Internal Medicine

## 2020-01-23 DIAGNOSIS — Z23 Encounter for immunization: Secondary | ICD-10-CM

## 2020-01-23 NOTE — Progress Notes (Signed)
   Covid-19 Vaccination Clinic  Name:  Tyler Kennedy    MRN: [id number] DOB: [birth year]  01/23/2020  Tyler Kennedy was observed post Covid-19 immunization for 15 minutes without incident. He was provided with Vaccine Information Sheet and instruction to access the V-Safe system.   Tyler Kennedy was instructed to call 911 with any severe reactions post vaccine: Marland Kitchen Difficulty breathing  . Swelling of face and throat  . A fast heartbeat  . A bad rash all over body  . Dizziness and weakness   Immunizations Administered    Name Date Dose VIS Date Route   Pfizer COVID-19 Vaccine 01/23/2020  2:45 PM 0.3 mL 10/29/2019 Intramuscular   Manufacturer: [place]   Lot: [id number]   [place]: [address]-4

## 2020-01-27 ENCOUNTER — Ambulatory Visit: Payer: 59

## 2020-01-27 ENCOUNTER — Other Ambulatory Visit: Payer: Self-pay | Admitting: Student

## 2020-01-27 DIAGNOSIS — S52532D Colles' fracture of left radius, subsequent encounter for closed fracture with routine healing: Secondary | ICD-10-CM | POA: Diagnosis not present

## 2020-02-05 ENCOUNTER — Ambulatory Visit: Payer: 59 | Attending: Student | Admitting: Occupational Therapy

## 2020-02-05 ENCOUNTER — Encounter: Payer: Self-pay | Admitting: Occupational Therapy

## 2020-02-05 ENCOUNTER — Other Ambulatory Visit: Payer: Self-pay

## 2020-02-05 DIAGNOSIS — R600 Localized edema: Secondary | ICD-10-CM | POA: Diagnosis not present

## 2020-02-05 DIAGNOSIS — M25642 Stiffness of left hand, not elsewhere classified: Secondary | ICD-10-CM | POA: Insufficient documentation

## 2020-02-05 DIAGNOSIS — M6281 Muscle weakness (generalized): Secondary | ICD-10-CM | POA: Insufficient documentation

## 2020-02-05 DIAGNOSIS — L905 Scar conditions and fibrosis of skin: Secondary | ICD-10-CM | POA: Insufficient documentation

## 2020-02-05 DIAGNOSIS — M25632 Stiffness of left wrist, not elsewhere classified: Secondary | ICD-10-CM | POA: Insufficient documentation

## 2020-02-05 NOTE — Patient Instructions (Signed)
Contrast 3 x day AROM for Thumb PA and RA Opposition to all digits -slide down 5th -stop when feeling pull Tendon glides - AROM - block wrist and MC - 10 reps   AROM for wrist sup/pro , flexion ,ext , RD, UD  10 reps  When sitting rest forearm in neutral and work on supination

## 2020-02-05 NOTE — Therapy (Signed)
[facility] [address], [place], [address] Phone: [phone]   Fax:  [phone]  Occupational Therapy Evaluation  Patient Details  Name: Tyler Kennedy MRN: [id number] Date of Birth: [birth year] Referring Provider (OT): Dr Leslye Peer and Cameron Proud   Encounter Date: 02/05/2020   OT End of Session - 02/05/20 1013    Visit Number 1    Number of Visits 12    Date for OT Re-Evaluation 03/25/20    OT Start Time 0902    OT Stop Time 0956    OT Time Calculation (min) 54 min    Activity Tolerance Patient tolerated treatment well    Behavior During Therapy [facility] for tasks assessed/performed           Past Medical History:  Diagnosis Date  . History of dysplastic nevus 06/06/2017   L scapula, moderate to severe atypia, excised 10/16/2017  . Hx of dysplastic nevus 10/23/2017   R mid back lateral 11cm lat to spine, mild atypia  . Hx of dysplastic nevus 10/23/2017   R low back lat flank, 15.0cm lat to spine, mild atypia    Past Surgical History:  Procedure Laterality Date  . ORIF WRIST FRACTURE Left 01/13/2020   Procedure: OPEN REDUCTION INTERNAL FIXATION (ORIF), left distal radius;  Surgeon: Corky Mull, MD;  Location: [facility] ORS;  Service: Orthopedics;  Laterality: Left;    There were no vitals filed for this visit.   Subjective Assessment - 02/05/20 1002    Subjective  I do take my splint of during the day some - keep it on about 4-5 hrs day - not using it really - little in bathing and dressing - not driving yet    Pertinent History Pt fell off hoveboard on 01/10/20 and had ORIF of distal radius fx by Dr Roland Rack on 01/13/20 - ulnar styloid fx but stable-follow up in 01/27/20 xray show great aligment and this date sterristrip still in place on volar scar- refer to OT    Patient Stated Goals Want to be able to get my motion and strength back in my L dominant hand and wrist so I can use it at home and get back to work  using computer, phone, 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 Yes    Pain Score 2    increae to 5/10 with AROM   Pain Location Wrist    Pain Orientation Left    Pain Descriptors / Indicators Aching;Tightness    Pain Type Surgical pain;Acute pain    Pain Onset 1 to 4 weeks ago    Pain Frequency Intermittent             [facility] OT Assessment - 02/05/20 0001      Assessment   Medical Diagnosis L distal radius ORIF    Referring Provider (OT) Dr Leslye Peer and Cameron Proud    Onset Date/Surgical Date --   01/10/20 ORIF was 01/13/20   Hand Dominance Left    Next MD Visit 02/23/20      Balance Screen   Has the patient fallen in the past 6 months Yes    How many times? 1    Has the patient had a decrease in activity level because of a fear of falling?  No    Is the patient reluctant to leave their home because of a fear of falling?  No      Home  Environment  Lives With Spouse      Prior Function   Leisure work at [employer]oparts, videogames, yard work      AROM   Right Forearm Pronation 90 Degrees    Right Forearm Supination 90 Degrees    Left Forearm Pronation 75 Degrees    Left Forearm Supination 15 Degrees    Right Wrist Extension 60 Degrees    Right Wrist Flexion 95 Degrees    Right Wrist Radial Deviation 20 Degrees    Right Wrist Ulnar Deviation 38 Degrees    Left Wrist Extension 15 Degrees    Left Wrist Flexion 40 Degrees    Left Wrist Radial Deviation 12 Degrees    Left Wrist Ulnar Deviation 12 Degrees      Right Hand AROM   R Thumb Radial ABduction/ADduction 0-55 58    R Thumb Palmar ABduction/ADduction 0-45 65    R Thumb Opposition to Index --   Opp[facility] to [facility]     Left Hand AROM   L Thumb Radial ADduction/ABduction 0-55 48    L Thumb Palmar ADduction/ABduction 0-45 48    L Thumb Opposition to Index --   Opposition to 2nd fold of 5th -pain 4/10   L Index  MCP 0-90 80 Degrees    L Index PIP 0-100 90 Degrees    L Long  MCP 0-90 80 Degrees    L  Long PIP 0-100 85 Degrees    L Ring  MCP 0-90 80 Degrees    L Ring PIP 0-100 90 Degrees    L Little  MCP 0-90 80 Degrees    L Little PIP 0-100 90 Degrees                    OT Treatments/Exercises (OP) - 02/05/20 0001      LUE Contrast Bath   Time 8 minutes    Comments prior to review of HEP increase ROM , decrease edema , pain          Review with pt HEP and hand out provided :   Contrast 3 x day AROM for Thumb PA and RA Opposition to all digits -slide down 5th -stop when feeling pull Tendon glides - AROM - block wrist and MC - 10 reps   AROM for wrist sup/pro , flexion ,ext , RD, UD  10 reps  When sitting rest forearm in neutral and work on supination       OT Education - 02/05/20 1013    Education Details findings of eval and HEP    Person(s) Educated Patient    Methods Explanation;Demonstration;Tactile cues;Verbal cues;Handout    Comprehension Verbal cues required;Returned demonstration;Verbalized understanding            OT Short Term Goals - 02/05/20 1020      OT SHORT TERM GOAL #1   Title Pt to be independent in HEP to decrease edema and increase AROM in digits to WNL and initiate scar massage    Baseline no knowledge on HEP , increase edema, not able to make composite fist , sterri strips still in place    Time 2    Period Weeks    Status New    Target Date 02/19/20             OT Long Term Goals - 02/05/20 1021      OT LONG TERM GOAL #1   Title L wrist and forearm extention and supination increase with more than 40 degrees to  turn doorknob and reach wrist in to sleeve    Baseline wrist ext 15, sup 15 - functional use of L hand /wrist on PRWHE 44/50    Time 4    Period Weeks    Status New    Target Date 03/04/20      OT LONG TERM GOAL #2   Title L wrist AROM in all planes increase to [facility] for pt to increase use on PRWHE by 25 points and  return to work    Baseline AROM flexion 40, ext and sup 15, RD, UD 12, pro 75 - function on PRWHE  44/50    Time 6    Period Weeks    Status New    Target Date 03/18/20      OT LONG TERM GOAL #3   Title L grip and prehension strength increase to more than 60% compare to R hand to carry parts more than 5lbs, hold plate and return to work    Baseline NT - pt is 3 1/2 wks s/p    Time 7    Period Weeks    Status New    Target Date 03/25/20      OT LONG TERM GOAL #4   Title L wrist strength in all planes increase to 4+/5 to push door open, move chair , carry parts more than 5 lbs, turn doorknob    Baseline 3 1/2 wks s/p - only AROM at this moment- functional use PRWHE 44/50    Time 7    Period Weeks    Status New    Target Date 03/25/20                 Plan - 02/05/20 1014    Clinical Impression Statement Pt present at OT eval 3 1/2 wks s/p L distal radius fx ORIF. Pt is L hand dominant. Sterristrips still in place , pt in velcro splint, per pt 4-5 hrs during day and night time. Pt show decrease AROM , increase edema in hand and wrist, increase  risk for scar tissue and decrease strength - limiting his functional use of dominant hand in ADL's and IADL's -pt can benefit from skilled OT services .    OT Occupational Profile and History Problem Focused Assessment - Including review of records relating to presenting problem    Occupational performance deficits (Please refer to evaluation for details): ADL's;IADL's;Work;Play;Leisure;Social Participation    Body Structure / Function / Physical Skills ADL;Edema;Dexterity;Decreased knowledge of precautions;Flexibility;ROM;UE functional use;Scar mobility;FMC;Strength;Pain;IADL    Rehab Potential Good    Clinical Decision Making Limited treatment options, no task modification necessary    Comorbidities Affecting Occupational Performance: May have comorbidities impacting occupational performance   anxiety   Modification or Assistance to Complete Evaluation  No modification of tasks or assist necessary to complete eval    OT Frequency 2x /  week   decrease to 1 x wk as progress   OT Duration --   7 wks   OT Treatment/Interventions Self-care/ADL training;Paraffin;Fluidtherapy;Contrast Bath;Therapeutic exercise;Patient/family education;Splinting;Scar mobilization;Passive range of motion;Manual Therapy    Plan assess progress and upgrade HEP as needed    OT Home Exercise Plan see pt instruction    Consulted and Agree with Plan of Care Patient           Patient will benefit from skilled therapeutic intervention in order to improve the following deficits and impairments:   Body Structure / Function / Physical Skills: ADL,Edema,Dexterity,Decreased knowledge of precautions,Flexibility,ROM,UE functional use,Scar mobility,FMC,Strength,Pain,IADL  Visit Diagnosis: Stiffness of left wrist, not elsewhere classified - Plan: Ot plan of care cert/re-cert  Stiffness of left hand, not elsewhere classified - Plan: Ot plan of care cert/re-cert  Scar condition and fibrosis of skin - Plan: Ot plan of care cert/re-cert  Muscle weakness (generalized) - Plan: Ot plan of care cert/re-cert  Localized edema - Plan: Ot plan of care cert/re-cert    Problem List There are no problems to display for this patient.   Rosalyn Gess OTR/L,CLT 02/05/2020, 10:30 AM  [facility] [address] [place], [address] Phone: [phone]   Fax:  [phone]  Name: Tyler Kennedy MRN: [id number] Date of Birth: 02/09/1975

## 2020-02-10 ENCOUNTER — Other Ambulatory Visit: Payer: Self-pay

## 2020-02-10 ENCOUNTER — Ambulatory Visit: Payer: 59 | Attending: Student | Admitting: Occupational Therapy

## 2020-02-10 DIAGNOSIS — L905 Scar conditions and fibrosis of skin: Secondary | ICD-10-CM | POA: Insufficient documentation

## 2020-02-10 DIAGNOSIS — M25642 Stiffness of left hand, not elsewhere classified: Secondary | ICD-10-CM | POA: Diagnosis not present

## 2020-02-10 DIAGNOSIS — R600 Localized edema: Secondary | ICD-10-CM | POA: Insufficient documentation

## 2020-02-10 DIAGNOSIS — M25632 Stiffness of left wrist, not elsewhere classified: Secondary | ICD-10-CM | POA: Diagnosis not present

## 2020-02-10 DIAGNOSIS — M6281 Muscle weakness (generalized): Secondary | ICD-10-CM | POA: Diagnosis not present

## 2020-02-10 NOTE — Therapy (Signed)
[facility] [address], [place], [address] Phone: [phone]   Fax:  [phone]  Occupational Therapy Treatment  Patient Details  Name: Tyler Kennedy MRN: [id number] Date of Birth: 12-Jul-1975 Referring Provider (OT): Dr Leslye Peer and Cameron Proud   Encounter Date: 02/10/2020   OT End of Session - 02/10/20 1259    Visit Number 2    Number of Visits 12    Date for OT Re-Evaluation 03/25/20    OT Start Time 1300    OT Stop Time 1346    OT Time Calculation (min) 46 min    Activity Tolerance Patient tolerated treatment well    Behavior During Therapy [facility] for tasks assessed/performed           Past Medical History:  Diagnosis Date  . History of dysplastic nevus 06/06/2017   L scapula, moderate to severe atypia, excised 10/16/2017  . Hx of dysplastic nevus 10/23/2017   R mid back lateral 11cm lat to spine, mild atypia  . Hx of dysplastic nevus 10/23/2017   R low back lat flank, 15.0cm lat to spine, mild atypia    Past Surgical History:  Procedure Laterality Date  . ORIF WRIST FRACTURE Left 01/13/2020   Procedure: OPEN REDUCTION INTERNAL FIXATION (ORIF), left distal radius;  Surgeon: Corky Mull, MD;  Location: [facility] ORS;  Service: Orthopedics;  Laterality: Left;    There were no vitals filed for this visit.   Subjective Assessment - 02/10/20 1313    Subjective  Keeping splint off some at home - but on when going out and night time - did not take pain medication today    Pertinent History Pt fell off hoveboard on 01/10/20 and had ORIF of distal radius fx by Dr Roland Rack on 01/13/20 - ulnar styloid fx but stable-follow up in 01/27/20 xray show great aligment and this date sterristrip still in place on volar scar- refer to OT    Currently in Pain? Yes    Pain Score 4     Pain Location Wrist    Pain Orientation Left    Pain Descriptors / Indicators Aching;Tightness    Pain Type Surgical pain;Acute pain    Pain  Onset More than a month ago    Aggravating Factors  AROM for wrist              [facility] OT Assessment - 02/10/20 0001      AROM   Left Forearm Pronation 85 Degrees    Left Forearm Supination 20 Degrees   35 in session after sup/pro wheel   Left Wrist Extension 28 Degrees    Left Wrist Flexion 35 Degrees    Left Wrist Radial Deviation 15 Degrees    Left Wrist Ulnar Deviation 15 Degrees           increase AROM in all planes for L wrist and forearm except flexion - sterri strips still in place  Removed with no issues  Pt ed on scar massage - gentle -and will add cica scar pad next time          OT Treatments/Exercises (OP) - 02/10/20 0001      LUE Contrast Bath   Time 8 minutes    Comments prior to AROM and scar massage           AAROM for Thumb PA and RA and composite flexion 10 reps each Opposition to all digits -slide down 5th -stop when feeling pull  Tendon glides - AROM and some AAROM for MC flexion to 90 - able to stabilize wrist in neutral this date  AROM for wrist RD, UD and flexion over armrest and AAROM gentle for extention  10 reps  Add and review with pt use of supination wheel for sup, pronation - 15 reps  2-3 x day  Stop with all HEP when feeling slight pull          OT Education - 02/10/20 1316    Education Details progress and changes to HEP    Person(s) Educated Patient    Methods Explanation;Demonstration;Tactile cues;Verbal cues;Handout    Comprehension Verbal cues required;Returned demonstration;Verbalized understanding            OT Short Term Goals - 02/05/20 1020      OT SHORT TERM GOAL #1   Title Pt to be independent in HEP to decrease edema and increase AROM in digits to WNL and initiate scar massage    Baseline no knowledge on HEP , increase edema, not able to make composite fist , sterri strips still in place    Time 2    Period Weeks    Status New    Target Date 02/19/20             OT Long Term Goals - 02/05/20  1021      OT LONG TERM GOAL #1   Title L wrist and forearm extention and supination increase with more than 40 degrees to turn doorknob and reach wrist in to sleeve    Baseline wrist ext 15, sup 15 - functional use of L hand /wrist on PRWHE 44/50    Time 4    Period Weeks    Status New    Target Date 03/04/20      OT LONG TERM GOAL #2   Title L wrist AROM in all planes increase to [facility] for pt to increase use on PRWHE by 25 points and  return to work    Baseline AROM flexion 40, ext and sup 15, RD, UD 12, pro 75 - function on PRWHE 44/50    Time 6    Period Weeks    Status New    Target Date 03/18/20      OT LONG TERM GOAL #3   Title L grip and prehension strength increase to more than 60% compare to R hand to carry parts more than 5lbs, hold plate and return to work    Baseline NT - pt is 3 1/2 wks s/p    Time 7    Period Weeks    Status New    Target Date 03/25/20      OT LONG TERM GOAL #4   Title L wrist strength in all planes increase to 4+/5 to push door open, move chair , carry parts more than 5 lbs, turn doorknob    Baseline 3 1/2 wks s/p - only AROM at this moment- functional use PRWHE 44/50    Time 7    Period Weeks    Status New    Target Date 03/25/20                 Plan - 02/10/20 1259    Clinical Impression Statement Pt is 4 wks s/p L distal radius fx with ORIF. Pt is L hand dominant - pt do show increase AROM in most all wrist motion except flexion -pain with with all but show in session progres in all - add supination  wheel to pt HEP for sup/pro , focus on AROM composite fist with MC flexion to 90 , and gentle AAROM for wrist extention - sterristrips removed and initiated scar massage this date    OT Occupational Profile and History Problem Focused Assessment - Including review of records relating to presenting problem    Occupational performance deficits (Please refer to evaluation for details): ADL's;IADL's;Work;Play;Leisure;Social Participation    Body  Structure / Function / Physical Skills ADL;Edema;Dexterity;Decreased knowledge of precautions;Flexibility;ROM;UE functional use;Scar mobility;FMC;Strength;Pain;IADL    Rehab Potential Good    Clinical Decision Making Limited treatment options, no task modification necessary    Comorbidities Affecting Occupational Performance: May have comorbidities impacting occupational performance    Modification or Assistance to Complete Evaluation  No modification of tasks or assist necessary to complete eval    OT Frequency 2x / week    OT Duration 6 weeks    OT Treatment/Interventions Self-care/ADL training;Paraffin;Fluidtherapy;Contrast Bath;Therapeutic exercise;Patient/family education;Splinting;Scar mobilization;Passive range of motion;Manual Therapy    Plan assess progress and upgrade HEP as needed    OT Home Exercise Plan see pt instruction    Consulted and Agree with Plan of Care Patient           Patient will benefit from skilled therapeutic intervention in order to improve the following deficits and impairments:   Body Structure / Function / Physical Skills: ADL,Edema,Dexterity,Decreased knowledge of precautions,Flexibility,ROM,UE functional use,Scar mobility,FMC,Strength,Pain,IADL       Visit Diagnosis: Stiffness of left wrist, not elsewhere classified  Stiffness of left hand, not elsewhere classified  Scar condition and fibrosis of skin  Muscle weakness (generalized)  Localized edema    Problem List There are no problems to display for this patient.   Oletta Cohn OTR/l,CLT 02/10/2020, 3:38 PM  [facility] [address], [place], [address] Phone: [phone]   Fax:  [phone]  Name: LONNY EISEN MRN: [id number] Date of Birth: 1975-03-23

## 2020-02-12 ENCOUNTER — Ambulatory Visit: Payer: 59 | Admitting: Occupational Therapy

## 2020-02-12 ENCOUNTER — Other Ambulatory Visit: Payer: Self-pay

## 2020-02-12 DIAGNOSIS — M25632 Stiffness of left wrist, not elsewhere classified: Secondary | ICD-10-CM

## 2020-02-12 DIAGNOSIS — R600 Localized edema: Secondary | ICD-10-CM | POA: Diagnosis not present

## 2020-02-12 DIAGNOSIS — L905 Scar conditions and fibrosis of skin: Secondary | ICD-10-CM | POA: Diagnosis not present

## 2020-02-12 DIAGNOSIS — M25642 Stiffness of left hand, not elsewhere classified: Secondary | ICD-10-CM | POA: Diagnosis not present

## 2020-02-12 DIAGNOSIS — M6281 Muscle weakness (generalized): Secondary | ICD-10-CM

## 2020-02-12 NOTE — Therapy (Signed)
[facility] [address] [place], [address] Phone: [phone]   Fax:  [phone]  Occupational Therapy Treatment  Patient Details  Name: Tyler Kennedy MRN: [id number] Date of Birth: [birth year] Referring Kutter Schnepf (OT): Dr Leslye Peer and Cameron Proud   Encounter Date: 02/12/2020   OT End of Session - 02/12/20 1314    Visit Number 3    Number of Visits 12    Date for OT Re-Evaluation 03/25/20    OT Start Time 1300    OT Stop Time 1338    OT Time Calculation (min) 38 min    Activity Tolerance Patient tolerated treatment well    Behavior During Therapy [facility] for tasks assessed/performed           Past Medical History:  Diagnosis Date  . History of dysplastic nevus 06/06/2017   L scapula, moderate to severe atypia, excised 10/16/2017  . Hx of dysplastic nevus 10/23/2017   R mid back lateral 11cm lat to spine, mild atypia  . Hx of dysplastic nevus 10/23/2017   R low back lat flank, 15.0cm lat to spine, mild atypia    Past Surgical History:  Procedure Laterality Date  . ORIF WRIST FRACTURE Left 01/13/2020   Procedure: OPEN REDUCTION INTERNAL FIXATION (ORIF), left distal radius;  Surgeon: Corky Mull, MD;  Location: [facility] ORS;  Service: Orthopedics;  Laterality: Left;    There were no vitals filed for this visit.   Subjective Assessment - 02/12/20 1302    Subjective  No medication since am -did okay with my exercises - I can open refrigerator- put my clothes like pants or socks better    Pertinent History Pt fell off hoveboard on 01/10/20 and had ORIF of distal radius fx by Dr Roland Rack on 01/13/20 - ulnar styloid fx but stable-follow up in 01/27/20 xray show great aligment and this date sterristrip still in place on volar scar- refer to OT    Patient Stated Goals Want to be able to get my motion and strength back in my L dominant hand and wrist so I can use it at home and get back to work using computer, phone, write  and pull parts that were ordered    Currently in Pain? Yes    Pain Score 1     Pain Location Wrist    Pain Orientation Left    Pain Descriptors / Indicators Aching;Tightness    Pain Type Acute pain;Surgical pain    Pain Onset More than a month ago    Pain Frequency Intermittent              [facility] OT Assessment - 02/12/20 0001      AROM   Left Forearm Pronation 85 Degrees    Left Forearm Supination 25 Degrees    Left Wrist Extension 34 Degrees    Left Wrist Flexion 50 Degrees    Left Wrist Radial Deviation 20 Degrees    Left Wrist Ulnar Deviation 15 Degrees              measurements taken for wrist - great progress - see flowsheet      OT Treatments/Exercises (OP) - 02/12/20 0001      LUE Contrast Bath   Time 8 minutes    Comments prior to AROM -          During 2nd and 3rd rotation of heat - position into sup with light stretch     AAROM for  Thumb PA and RA and composite flexion 10 reps each Increase AROM with edema decrease Opposition to all digits -slide down 5th -stop when feeling pull- now towards base of 5th this date  Tendon glides - AROM and some AAROM for MC flexion to 90 - able to stabilize wrist in neutral this date  AROM for wrist RD, UD and flexion over armrest and AAROM gentle for extention  10 reps  Add and review with pt use of supination wheel for sup, pronation - 15 reps  And AAROM by OT for sup /pro  2-3 x day  Stop with all HEP when feeling slight pull        OT Education - 02/12/20 1314    Education Details progress and changes to HEP    Person(s) Educated Patient    Methods Explanation;Demonstration;Tactile cues;Verbal cues;Handout    Comprehension Verbal cues required;Returned demonstration;Verbalized understanding            OT Short Term Goals - 02/05/20 1020      OT SHORT TERM GOAL #1   Title Pt to be independent in HEP to decrease edema and increase AROM in digits to WNL and initiate scar massage    Baseline no  knowledge on HEP , increase edema, not able to make composite fist , sterri strips still in place    Time 2    Period Weeks    Status New    Target Date 02/19/20             OT Long Term Goals - 02/05/20 1021      OT LONG TERM GOAL #1   Title L wrist and forearm extention and supination increase with more than 40 degrees to turn doorknob and reach wrist in to sleeve    Baseline wrist ext 15, sup 15 - functional use of L hand /wrist on PRWHE 44/50    Time 4    Period Weeks    Status New    Target Date 03/04/20      OT LONG TERM GOAL #2   Title L wrist AROM in all planes increase to [facility] for pt to increase use on PRWHE by 25 points and  return to work    Baseline AROM flexion 40, ext and sup 15, RD, UD 12, pro 75 - function on PRWHE 44/50    Time 6    Period Weeks    Status New    Target Date 03/18/20      OT LONG TERM GOAL #3   Title L grip and prehension strength increase to more than 60% compare to R hand to carry parts more than 5lbs, hold plate and return to work    Baseline NT - pt is 3 1/2 wks s/p    Time 7    Period Weeks    Status New    Target Date 03/25/20      OT LONG TERM GOAL #4   Title L wrist strength in all planes increase to 4+/5 to push door open, move chair , carry parts more than 5 lbs, turn doorknob    Baseline 3 1/2 wks s/p - only AROM at this moment- functional use PRWHE 44/50    Time 7    Period Weeks    Status New    Target Date 03/25/20                 Plan - 02/12/20 1315    Clinical Impression Statement Pt is 4  1/2 wks s/p L distal radius fx with ORIF - pt L hand dominant - pt show progress AROM for L wrist and forearm in all planes- edema decrease in hand and wrist with use of isotoner glove and contrast - pain still increase with sup, wrist ext and UD  mostly - - composite fist improving - scar not ready for scar massage in middle - still tender    OT Occupational Profile and History Problem Focused Assessment - Including review of  records relating to presenting problem    Occupational performance deficits (Please refer to evaluation for details): ADL's;IADL's;Work;Play;Leisure;Social Participation    Body Structure / Function / Physical Skills ADL;Edema;Dexterity;Decreased knowledge of precautions;Flexibility;ROM;UE functional use;Scar mobility;FMC;Strength;Pain;IADL    Rehab Potential Good    Clinical Decision Making Limited treatment options, no task modification necessary    Comorbidities Affecting Occupational Performance: May have comorbidities impacting occupational performance    Modification or Assistance to Complete Evaluation  No modification of tasks or assist necessary to complete eval    OT Frequency 2x / week    OT Duration 6 weeks    OT Treatment/Interventions Self-care/ADL training;Paraffin;Fluidtherapy;Contrast Bath;Therapeutic exercise;Patient/family education;Splinting;Scar mobilization;Passive range of motion;Manual Therapy    Plan assess progress and upgrade HEP as needed    OT Home Exercise Plan see pt instruction    Consulted and Agree with Plan of Care Patient           Patient will benefit from skilled therapeutic intervention in order to improve the following deficits and impairments:   Body Structure / Function / Physical Skills: ADL,Edema,Dexterity,Decreased knowledge of precautions,Flexibility,ROM,UE functional use,Scar mobility,FMC,Strength,Pain,IADL       Visit Diagnosis: Stiffness of left wrist, not elsewhere classified  Stiffness of left hand, not elsewhere classified  Scar condition and fibrosis of skin  Muscle weakness (generalized)  Localized edema    Problem List There are no problems to display for this patient.   Rosalyn Gess OTR/L,CLT 02/12/2020, 1:40 PM  [facility] [address] [place], [address] Phone: [phone]   Fax:  [phone]  Name: Tyler Kennedy MRN: [id number] Date  of Birth: 10-31-1975

## 2020-02-16 ENCOUNTER — Ambulatory Visit: Payer: 59 | Admitting: Occupational Therapy

## 2020-02-16 ENCOUNTER — Other Ambulatory Visit: Payer: Self-pay

## 2020-02-16 DIAGNOSIS — M25632 Stiffness of left wrist, not elsewhere classified: Secondary | ICD-10-CM

## 2020-02-16 DIAGNOSIS — M25642 Stiffness of left hand, not elsewhere classified: Secondary | ICD-10-CM

## 2020-02-16 DIAGNOSIS — M6281 Muscle weakness (generalized): Secondary | ICD-10-CM | POA: Diagnosis not present

## 2020-02-16 DIAGNOSIS — R600 Localized edema: Secondary | ICD-10-CM

## 2020-02-16 DIAGNOSIS — L905 Scar conditions and fibrosis of skin: Secondary | ICD-10-CM | POA: Diagnosis not present

## 2020-02-16 NOTE — Therapy (Signed)
[facility] [address], [place], [address] Phone: [phone]   Fax:  [phone]  Occupational Therapy Treatment  Patient Details  Name: Tyler Kennedy MRN: [id number] Date of Birth: 11/19/1975 Referring Provider (OT): Dr Leslye Peer and Cameron Proud   Encounter Date: 02/16/2020   OT End of Session - 02/16/20 1038    Visit Number 4    Number of Visits 12    Date for OT Re-Evaluation 03/25/20    OT Start Time 1019    OT Stop Time 1104    OT Time Calculation (min) 45 min    Activity Tolerance Patient tolerated treatment well    Behavior During Therapy [facility] for tasks assessed/performed           Past Medical History:  Diagnosis Date  . History of dysplastic nevus 06/06/2017   L scapula, moderate to severe atypia, excised 10/16/2017  . Hx of dysplastic nevus 10/23/2017   R mid back lateral 11cm lat to spine, mild atypia  . Hx of dysplastic nevus 10/23/2017   R low back lat flank, 15.0cm lat to spine, mild atypia    Past Surgical History:  Procedure Laterality Date  . ORIF WRIST FRACTURE Left 01/13/2020   Procedure: OPEN REDUCTION INTERNAL FIXATION (ORIF), left distal radius;  Surgeon: Corky Mull, MD;  Location: [facility] ORS;  Service: Orthopedics;  Laterality: Left;    There were no vitals filed for this visit.   Subjective Assessment - 02/16/20 1032    Subjective  Swelling is coming down - scar massage I have done little - splint I stop wearing most all the time - only out and about - 3 x day I take something for pain    Pertinent History Pt fell off hoveboard on 01/10/20 and had ORIF of distal radius fx by Dr Roland Rack on 01/13/20 - ulnar styloid fx but stable-follow up in 01/27/20 xray show great aligment and this date sterristrip still in place on volar scar- refer to OT    Patient Stated Goals Want to be able to get my motion and strength back in my L dominant hand and wrist so I can use it at home and get back  to work using computer, phone, 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 Yes    Pain Score 3     Pain Location Wrist    Pain Orientation Left    Pain Descriptors / Indicators Aching;Tightness    Pain Type Acute pain;Surgical pain    Pain Onset More than a month ago    Pain Frequency Intermittent    Aggravating Factors  AROM of wrist              [facility] OT Assessment - 02/16/20 0001      AROM   Left Forearm Pronation 85 Degrees    Left Forearm Supination 35 Degrees   45 in session   Left Wrist Extension 34 Degrees    Left Wrist Flexion 52 Degrees    Left Wrist Radial Deviation 20 Degrees    Left Wrist Ulnar Deviation 30 Degrees           Pt report with his compression glove not using splint at night time and using splint only when out and about  Taking pain meds about 3 x day  And          OT Treatments/Exercises (OP) - 02/16/20 0001  LUE Contrast Bath   Time 8 minutes    Comments prior to AROM , stretch sup 2  x 2 min during contrast          Review with pt scar massage and done by OT - hand out provided and cica scar pad provided and ed on using at night time    During 2nd and 3rd rotation of heat - position into sup with light stretch    AAROM for Thumb PA and RAand composite flexion 10 reps each Increase AROM with edema decrease Opposition to all digits -slide down 5th -stop when feeling pull- now towards base of 5th this date  Tendon glides - AROMand some AAROM for MC flexion to 90 - able to stabilize wrist in neutral this date  Add this date AAROM over edge of table for RD, UD, flexion , extention - pain free  AROM for wristRD, UD and flexion over armrest and AAROM gentle for extention 10 reps Add and review with pt use of supination wheel for sup, pronation - 15 reps  And AAROM by OT for sup /pro progress  2-3 x day  Stop with all HEP when feeling slight pull       OT Education - 02/16/20 1038    Education  Details progress and changes to HEP    Person(s) Educated Patient    Methods Explanation;Demonstration;Tactile cues;Verbal cues;Handout    Comprehension Verbal cues required;Returned demonstration;Verbalized understanding            OT Short Term Goals - 02/05/20 1020      OT SHORT TERM GOAL #1   Title Pt to be independent in HEP to decrease edema and increase AROM in digits to WNL and initiate scar massage    Baseline no knowledge on HEP , increase edema, not able to make composite fist , sterri strips still in place    Time 2    Period Weeks    Status New    Target Date 02/19/20             OT Long Term Goals - 02/05/20 1021      OT LONG TERM GOAL #1   Title L wrist and forearm extention and supination increase with more than 40 degrees to turn doorknob and reach wrist in to sleeve    Baseline wrist ext 15, sup 15 - functional use of L hand /wrist on PRWHE 44/50    Time 4    Period Weeks    Status New    Target Date 03/04/20      OT LONG TERM GOAL #2   Title L wrist AROM in all planes increase to [facility] for pt to increase use on PRWHE by 25 points and  return to work    Baseline AROM flexion 40, ext and sup 15, RD, UD 12, pro 75 - function on PRWHE 44/50    Time 6    Period Weeks    Status New    Target Date 03/18/20      OT LONG TERM GOAL #3   Title L grip and prehension strength increase to more than 60% compare to R hand to carry parts more than 5lbs, hold plate and return to work    Baseline NT - pt is 3 1/2 wks s/p    Time 7    Period Weeks    Status New    Target Date 03/25/20      OT LONG TERM GOAL #4  Title L wrist strength in all planes increase to 4+/5 to push door open, move chair , carry parts more than 5 lbs, turn doorknob    Baseline 3 1/2 wks s/p - only AROM at this moment- functional use PRWHE 44/50    Time 7    Period Weeks    Status New    Target Date 03/25/20                 Plan - 02/16/20 1039    Clinical Impression Statement PT  is 5 wks s/p L distal radius fx with ORIF - pt L hand dominant - showed increase supination this date and UD- but wrist extentio nand flexion about same- able to initiate more scar massage- and ed pt on using still his splint during day some and night time to decrease pain to tolerate therex and AAROM intiated this date -    OT Occupational Profile and History Problem Focused Assessment - Including review of records relating to presenting problem    Occupational performance deficits (Please refer to evaluation for details): ADL's;IADL's;Work;Play;Leisure;Social Participation    Body Structure / Function / Physical Skills ADL;Edema;Dexterity;Decreased knowledge of precautions;Flexibility;ROM;UE functional use;Scar mobility;FMC;Strength;Pain;IADL    Rehab Potential Good    Clinical Decision Making Limited treatment options, no task modification necessary    Comorbidities Affecting Occupational Performance: May have comorbidities impacting occupational performance    Modification or Assistance to Complete Evaluation  No modification of tasks or assist necessary to complete eval    OT Frequency 2x / week    OT Duration 6 weeks    OT Treatment/Interventions Self-care/ADL training;Paraffin;Fluidtherapy;Contrast Bath;Therapeutic exercise;Patient/family education;Splinting;Scar mobilization;Passive range of motion;Manual Therapy    Plan assess progress and upgrade HEP as needed    OT Home Exercise Plan see pt instruction    Consulted and Agree with Plan of Care Patient           Patient will benefit from skilled therapeutic intervention in order to improve the following deficits and impairments:   Body Structure / Function / Physical Skills: ADL,Edema,Dexterity,Decreased knowledge of precautions,Flexibility,ROM,UE functional use,Scar mobility,FMC,Strength,Pain,IADL       Visit Diagnosis: Stiffness of left wrist, not elsewhere classified  Stiffness of left hand, not elsewhere classified  Scar  condition and fibrosis of skin  Muscle weakness (generalized)  Localized edema    Problem List There are no problems to display for this patient.   Rosalyn Gess OTR/L,CLT 02/16/2020, 12:15 PM  [facility] [address] [place], [address] Phone: [phone]   Fax:  [phone]  Name: JAYLON LIMBACHER MRN: [id number] Date of Birth: [birth year]

## 2020-02-19 ENCOUNTER — Other Ambulatory Visit: Payer: Self-pay

## 2020-02-19 ENCOUNTER — Ambulatory Visit: Payer: 59 | Admitting: Occupational Therapy

## 2020-02-19 DIAGNOSIS — M6281 Muscle weakness (generalized): Secondary | ICD-10-CM

## 2020-02-19 DIAGNOSIS — M25642 Stiffness of left hand, not elsewhere classified: Secondary | ICD-10-CM

## 2020-02-19 DIAGNOSIS — M25632 Stiffness of left wrist, not elsewhere classified: Secondary | ICD-10-CM | POA: Diagnosis not present

## 2020-02-19 DIAGNOSIS — R600 Localized edema: Secondary | ICD-10-CM | POA: Diagnosis not present

## 2020-02-19 DIAGNOSIS — L905 Scar conditions and fibrosis of skin: Secondary | ICD-10-CM

## 2020-02-19 NOTE — Therapy (Signed)
[place] [facility] [address] [place], [address] Phone: [phone]   Fax:  [phone]  Occupational Therapy Treatment  Patient Details  Name: Tyler Kennedy MRN: [id number] Date of Birth: 1975-10-10 Referring Provider (OT): Dr Leslye Peer and Cameron Proud   Encounter Date: 02/19/2020   OT End of Session - 02/19/20 1348    Visit Number 5    Number of Visits 12    Date for OT Re-Evaluation 03/25/20    OT Start Time 1335    OT Stop Time 1414    OT Time Calculation (min) 39 min    Activity Tolerance Patient tolerated treatment well    Behavior During Therapy [facility] for tasks assessed/performed           Past Medical History:  Diagnosis Date  . History of dysplastic nevus 06/06/2017   L scapula, moderate to severe atypia, excised 10/16/2017  . Hx of dysplastic nevus 10/23/2017   R mid back lateral 11cm lat to spine, mild atypia  . Hx of dysplastic nevus 10/23/2017   R low back lat flank, 15.0cm lat to spine, mild atypia    Past Surgical History:  Procedure Laterality Date  . ORIF WRIST FRACTURE Left 01/13/2020   Procedure: OPEN REDUCTION INTERNAL FIXATION (ORIF), left distal radius;  Surgeon: Corky Mull, MD;  Location: [facility] ORS;  Service: Orthopedics;  Laterality: Left;    There were no vitals filed for this visit.   Subjective Assessment - 02/19/20 1338    Subjective  A lot less pain - I wore my splint more during day  - did not had to take any painmeds - no pain going back with wrist - more of pull now - but pain much better    Pertinent History Pt fell off hoveboard on 01/10/20 and had ORIF of distal radius fx by Dr Roland Rack on 01/13/20 - ulnar styloid fx but stable-follow up in 01/27/20 xray show great aligment and this date sterristrip still in place on volar scar- refer to OT    Patient Stated Goals Want to be able to get my motion and strength back in my L dominant hand and wrist so I can use it at home and get  back to work using computer, phone, 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 Yes    Pain Score 0-No pain    Pain Location Wrist    Pain Orientation Left    Pain Descriptors / Indicators Aching;Tingling    Pain Type Acute pain;Surgical pain    Pain Onset More than a month ago    Pain Frequency Intermittent              [facility] OT Assessment - 02/19/20 0001      AROM   Left Forearm Supination 40 Degrees    Left Wrist Extension 40 Degrees    Left Wrist Flexion 50 Degrees           progress in scar tissue , wrist extention and supination - wrist flexion about same      Review with pt scar massage and done by OT- used mini massager this date   hand out provided and cica scar pad provided and ed on using at night time last time   During 2nd and 3rd rotation of heat - position into sup/ wrist flexion  with light stretch  AAROM for Thumb PA and RAand composite flexion 10 reps each Increase  AROM with edema decrease Opposition to all digits -slide down 5th -stop when feeling pull- now towards base of 5th this date Tendon glides - AROMand some AAROM for MC flexion to 90 - able to stabilize wrist in neutral this date  Review  AAROM over edge of table for RD, UD, flexion , extention - pain free  -focus on wrist ext and flexion - not putting weight thru palm Progress to 55 for flexion in session  AROM for wristRD, UD and flexion over armrest and AAROM gentle for extention 10 reps Add light 1/2 lbs weight  supination wheel for sup, pronation - 15 reps- can do just sup for first 15 and then pron and sup last 15 reps And AAROM by OT for sup /pro progress - improved to 55 degrees in session sup 2-3 x day  Stop with all HEP when feeling slight pull               OT Education - 02/19/20 1350    Education Details progress and changes to HEP    Person(s) Educated Patient    Methods Explanation;Demonstration;Tactile cues;Verbal cues;Handout     Comprehension Verbal cues required;Returned demonstration;Verbalized understanding            OT Short Term Goals - 02/05/20 1020      OT SHORT TERM GOAL #1   Title Pt to be independent in HEP to decrease edema and increase AROM in digits to WNL and initiate scar massage    Baseline no knowledge on HEP , increase edema, not able to make composite fist , sterri strips still in place    Time 2    Period Weeks    Status New    Target Date 02/19/20             OT Long Term Goals - 02/05/20 1021      OT LONG TERM GOAL #1   Title L wrist and forearm extention and supination increase with more than 40 degrees to turn doorknob and reach wrist in to sleeve    Baseline wrist ext 15, sup 15 - functional use of L hand /wrist on PRWHE 44/50    Time 4    Period Weeks    Status New    Target Date 03/04/20      OT LONG TERM GOAL #2   Title L wrist AROM in all planes increase to [facility] for pt to increase use on PRWHE by 25 points and  return to work    Baseline AROM flexion 40, ext and sup 15, RD, UD 12, pro 75 - function on PRWHE 44/50    Time 6    Period Weeks    Status New    Target Date 03/18/20      OT LONG TERM GOAL #3   Title L grip and prehension strength increase to more than 60% compare to R hand to carry parts more than 5lbs, hold plate and return to work    Baseline NT - pt is 3 1/2 wks s/p    Time 7    Period Weeks    Status New    Target Date 03/25/20      OT LONG TERM GOAL #4   Title L wrist strength in all planes increase to 4+/5 to push door open, move chair , carry parts more than 5 lbs, turn doorknob    Baseline 3 1/2 wks s/p - only AROM at this moment- functional use PRWHE 44/50  Time 7    Period Weeks    Status New    Target Date 03/25/20                 Plan - 02/19/20 1348    Clinical Impression Statement Pt is 5 1/2 wks s/p L distal radius fx with ORIF - pt L hand domimant - pt has some anxiety - show less pain this date with wearing his splint  little more - did come out of his splint to fast and to long -making progress with scar tissue, wrist extention this date and supination - has supination wheel on borrow to use at home -and focus this date on flexion of wrist  - cont to increase ROM and decrease scar tissue, edema and pain -and initiate strengthening    OT Occupational Profile and History Problem Focused Assessment - Including review of records relating to presenting problem    Occupational performance deficits (Please refer to evaluation for details): ADL's;IADL's;Work;Play;Leisure;Social Participation    Body Structure / Function / Physical Skills ADL;Edema;Dexterity;Decreased knowledge of precautions;Flexibility;ROM;UE functional use;Scar mobility;FMC;Strength;Pain;IADL    Rehab Potential Good    Clinical Decision Making Limited treatment options, no task modification necessary    Comorbidities Affecting Occupational Performance: May have comorbidities impacting occupational performance    Modification or Assistance to Complete Evaluation  No modification of tasks or assist necessary to complete eval    OT Frequency 2x / week    OT Duration 6 weeks    OT Treatment/Interventions Self-care/ADL training;Paraffin;Fluidtherapy;Contrast Bath;Therapeutic exercise;Patient/family education;Splinting;Scar mobilization;Passive range of motion;Manual Therapy    Plan assess progress and upgrade HEP as needed    OT Home Exercise Plan see pt instruction    Consulted and Agree with Plan of Care Patient           Patient will benefit from skilled therapeutic intervention in order to improve the following deficits and impairments:   Body Structure / Function / Physical Skills: ADL,Edema,Dexterity,Decreased knowledge of precautions,Flexibility,ROM,UE functional use,Scar mobility,FMC,Strength,Pain,IADL       Visit Diagnosis: Stiffness of left wrist, not elsewhere classified  Stiffness of left hand, not elsewhere classified  Scar  condition and fibrosis of skin  Muscle weakness (generalized)  Localized edema    Problem List There are no problems to display for this patient.   Rosalyn Gess OTR/l,CLT 02/19/2020, 2:26 PM  [place] [facility] [address], [place], [address] Phone: [phone]   Fax:  [phone]  Name: WALTHER SANAGUSTIN MRN: [id number] Date of Birth: [birth year]

## 2020-02-23 DIAGNOSIS — S52532D Colles' fracture of left radius, subsequent encounter for closed fracture with routine healing: Secondary | ICD-10-CM | POA: Diagnosis not present

## 2020-02-24 ENCOUNTER — Ambulatory Visit: Payer: 59 | Admitting: Occupational Therapy

## 2020-02-24 ENCOUNTER — Other Ambulatory Visit: Payer: Self-pay

## 2020-02-24 DIAGNOSIS — R600 Localized edema: Secondary | ICD-10-CM

## 2020-02-24 DIAGNOSIS — M6281 Muscle weakness (generalized): Secondary | ICD-10-CM | POA: Diagnosis not present

## 2020-02-24 DIAGNOSIS — M25632 Stiffness of left wrist, not elsewhere classified: Secondary | ICD-10-CM | POA: Diagnosis not present

## 2020-02-24 DIAGNOSIS — M25642 Stiffness of left hand, not elsewhere classified: Secondary | ICD-10-CM | POA: Diagnosis not present

## 2020-02-24 DIAGNOSIS — L905 Scar conditions and fibrosis of skin: Secondary | ICD-10-CM

## 2020-02-24 NOTE — Therapy (Signed)
[place] [facility] [address] [place], [address] Phone: [phone]   Fax:  [phone]  Occupational Therapy Treatment  Patient Details  Name: KHRISTOPHER KAPAUN MRN: [id number] Date of Birth: November 27, 1975 Referring Provider (OT): Dr Leslye Peer and Cameron Proud   Encounter Date: 02/24/2020   OT End of Session - 02/24/20 0925    Visit Number 6    Number of Visits 12    Date for OT Re-Evaluation 03/25/20    OT Start Time 0906    OT Stop Time 0946    OT Time Calculation (min) 40 min    Activity Tolerance Patient tolerated treatment well    Behavior During Therapy [facility] for tasks assessed/performed           Past Medical History:  Diagnosis Date  . History of dysplastic nevus 06/06/2017   L scapula, moderate to severe atypia, excised 10/16/2017  . Hx of dysplastic nevus 10/23/2017   R mid back lateral 11cm lat to spine, mild atypia  . Hx of dysplastic nevus 10/23/2017   R low back lat flank, 15.0cm lat to spine, mild atypia    Past Surgical History:  Procedure Laterality Date  . ORIF WRIST FRACTURE Left 01/13/2020   Procedure: OPEN REDUCTION INTERNAL FIXATION (ORIF), left distal radius;  Surgeon: Corky Mull, MD;  Location: [facility] ORS;  Service: Orthopedics;  Laterality: Left;    There were no vitals filed for this visit.   Subjective Assessment - 02/24/20 0908    Subjective  Xrayed looked good, going back to work tomorrow - light duty - pain good - using it more at home    Pertinent History Pt fell off hoveboard on 01/10/20 and had ORIF of distal radius fx by Dr Roland Rack on 01/13/20 - ulnar styloid fx but stable-follow up in 01/27/20 xray show great aligment and this date sterristrip still in place on volar scar- refer to OT    Patient Stated Goals Want to be able to get my motion and strength back in my L dominant hand and wrist so I can use it at home and get back to work using computer, phone, write and pull parts that  were ordered    Currently in Pain? Yes    Pain Score 1     Pain Location Wrist    Pain Orientation Left    Pain Descriptors / Indicators Aching;Tightness    Pain Type Acute pain;Surgical pain    Pain Onset More than a month ago    Pain Frequency Intermittent          assess what pt can pick up and carry going back to work tomorrow- 3 lbs one handed with L and then 10 lbs bilateral   put splint on with anything that is heavier or cause pain      [facility] OT Assessment - 02/24/20 0001      AROM   Left Forearm Supination 55 Degrees    Left Wrist Extension 40 Degrees    Left Wrist Flexion 62 Degrees    Left Wrist Radial Deviation 20 Degrees    Left Wrist Ulnar Deviation 35 Degrees      Strength   Right Hand Grip (lbs) 110    Right Hand Lateral Pinch 26 lbs    Right Hand 3 Point Pinch 28 lbs    Left Hand Grip (lbs) 32    Left Hand Lateral Pinch 15 lbs    Left Hand 3 Point  Pinch 9 lbs          Great progress except wrist extention -pt to do double time AAROM for ext for every flexion  Got baseline for grip and prehension - see flowsheet     progress in scar tissue , wrist extention and supination - wrist flexion about same    Review with pt scar massage and done by OT- used mini massager this date and xtractor  hand out provided and cica scar pad provided and ed on using at night timelast time   Using cica scar pad night time   AAROM for Thumb PA and RAand composite flexion 10 reps each Increase AROM with edema decrease Opposition to all digits -slide down 5th -able to get to base of 5th  Grip strength and prehension assess this date - see flowsheet  Tendon glides in fluido  - AROMand some AAROM for MC flexion to 90 - able to stabilize wrist in neutral this date  Cont at home  AAROM over edge of table for RD, UD, flexion , extention - pain free -focus on wrist ext and flexion - not putting weight thru palm  Upgrade to 1 lbs weight for wristRD, UD and  flexion, ext ,sup and pronation after AAROM and sup wheel 12 reps - 2 x day pain free Add light 1/2 lbs weight last time to  supination wheel for sup, pronation - 15 reps- can do just sup for first 15 and then pron and sup last 15 reps  2-3 x day  Stop with all HEP when feeling slight pull        OT Treatments/Exercises (OP) - 02/24/20 0001      LUE Fluidotherapy   Number Minutes Fluidotherapy 8 Minutes    LUE Fluidotherapy Location Wrist    Comments AROM for digits , wrist SOC - decrease stiffness                  OT Education - 02/24/20 0911    Education Details progress and changes to HEP    Person(s) Educated Patient    Methods Explanation;Demonstration;Tactile cues;Verbal cues;Handout    Comprehension Verbal cues required;Returned demonstration;Verbalized understanding            OT Short Term Goals - 02/05/20 1020      OT SHORT TERM GOAL #1   Title Pt to be independent in HEP to decrease edema and increase AROM in digits to WNL and initiate scar massage    Baseline no knowledge on HEP , increase edema, not able to make composite fist , sterri strips still in place    Time 2    Period Weeks    Status New    Target Date 02/19/20             OT Long Term Goals - 02/05/20 1021      OT LONG TERM GOAL #1   Title L wrist and forearm extention and supination increase with more than 40 degrees to turn doorknob and reach wrist in to sleeve    Baseline wrist ext 15, sup 15 - functional use of L hand /wrist on PRWHE 44/50    Time 4    Period Weeks    Status New    Target Date 03/04/20      OT LONG TERM GOAL #2   Title L wrist AROM in all planes increase to [facility] for pt to increase use on PRWHE by 25 points and  return to work  Baseline AROM flexion 40, ext and sup 15, RD, UD 12, pro 75 - function on PRWHE 44/50    Time 6    Period Weeks    Status New    Target Date 03/18/20      OT LONG TERM GOAL #3   Title L grip and prehension strength increase  to more than 60% compare to R hand to carry parts more than 5lbs, hold plate and return to work    Baseline NT - pt is 3 1/2 wks s/p    Time 7    Period Weeks    Status New    Target Date 03/25/20      OT LONG TERM GOAL #4   Title L wrist strength in all planes increase to 4+/5 to push door open, move chair , carry parts more than 5 lbs, turn doorknob    Baseline 3 1/2 wks s/p - only AROM at this moment- functional use PRWHE 44/50    Time 7    Period Weeks    Status New    Target Date 03/25/20                 Plan - 02/24/20 0925    Clinical Impression Statement Pt is about 6 wks s/p L distal radius fx with ORIF - pt seen Dr Roland Rack yesterday and can return to work - light duty under 20Lbs - pt was able to pick up and carry 3 lbs on L and bilateral 10 lbs - otherwise had pain at the wrist- pt to wear wrist splint as needed when pain with activity - making great progress in ROM - except wrist extention limited by scar tissue- initiate strengthening this date with 1 lbs weight - focus on sup, wrist ext and scar tissue    OT Occupational Profile and History Problem Focused Assessment - Including review of records relating to presenting problem    Occupational performance deficits (Please refer to evaluation for details): ADL's;IADL's;Work;Play;Leisure;Social Participation    Body Structure / Function / Physical Skills ADL;Edema;Dexterity;Decreased knowledge of precautions;Flexibility;ROM;UE functional use;Scar mobility;FMC;Strength;Pain;IADL    Rehab Potential Good    Clinical Decision Making Limited treatment options, no task modification necessary    Comorbidities Affecting Occupational Performance: May have comorbidities impacting occupational performance    Modification or Assistance to Complete Evaluation  No modification of tasks or assist necessary to complete eval    OT Frequency 2x / week    OT Duration 6 weeks    OT Treatment/Interventions Self-care/ADL  training;Paraffin;Fluidtherapy;Contrast Bath;Therapeutic exercise;Patient/family education;Splinting;Scar mobilization;Passive range of motion;Manual Therapy    Plan assess progress and upgrade HEP as needed    OT Home Exercise Plan see pt instruction    Consulted and Agree with Plan of Care Patient           Patient will benefit from skilled therapeutic intervention in order to improve the following deficits and impairments:   Body Structure / Function / Physical Skills: ADL,Edema,Dexterity,Decreased knowledge of precautions,Flexibility,ROM,UE functional use,Scar mobility,FMC,Strength,Pain,IADL       Visit Diagnosis: Stiffness of left wrist, not elsewhere classified  Stiffness of left hand, not elsewhere classified  Scar condition and fibrosis of skin  Muscle weakness (generalized)  Localized edema    Problem List There are no problems to display for this patient.   Rosalyn Gess OTR/L,CLT 02/24/2020, 9:46 AM  [place] [facility] [address], [place], [address] Phone: [phone]   Fax:  [phone]  Name: ROBY DONAWAY MRN: [id number] Date of Birth: 02/08/1975

## 2020-02-26 ENCOUNTER — Other Ambulatory Visit: Payer: Self-pay

## 2020-02-26 ENCOUNTER — Ambulatory Visit: Payer: 59 | Admitting: Occupational Therapy

## 2020-02-26 DIAGNOSIS — M25642 Stiffness of left hand, not elsewhere classified: Secondary | ICD-10-CM

## 2020-02-26 DIAGNOSIS — L905 Scar conditions and fibrosis of skin: Secondary | ICD-10-CM

## 2020-02-26 DIAGNOSIS — M6281 Muscle weakness (generalized): Secondary | ICD-10-CM | POA: Diagnosis not present

## 2020-02-26 DIAGNOSIS — M25632 Stiffness of left wrist, not elsewhere classified: Secondary | ICD-10-CM

## 2020-02-26 DIAGNOSIS — R600 Localized edema: Secondary | ICD-10-CM | POA: Diagnosis not present

## 2020-02-26 NOTE — Patient Instructions (Addendum)
T

## 2020-02-26 NOTE — Therapy (Signed)
[place] [facility] [address], [place], [address] Phone: [phone]   Fax:  [phone]  Occupational Therapy Treatment  Patient Details  Name: Tyler Kennedy MRN: [id number] Date of Birth: 09/18/1975 Referring Provider (OT): Dr Leslye Peer and Cameron Proud   Encounter Date: 02/26/2020   OT End of Session - 02/26/20 0920    Visit Number 7    Number of Visits 12    Date for OT Re-Evaluation 03/25/20    OT Start Time 0905    OT Stop Time 0945    OT Time Calculation (min) 40 min    Activity Tolerance Patient tolerated treatment well    Behavior During Therapy [facility] for tasks assessed/performed           Past Medical History:  Diagnosis Date  . History of dysplastic nevus 06/06/2017   L scapula, moderate to severe atypia, excised 10/16/2017  . Hx of dysplastic nevus 10/23/2017   R mid back lateral 11cm lat to spine, mild atypia  . Hx of dysplastic nevus 10/23/2017   R low back lat flank, 15.0cm lat to spine, mild atypia    Past Surgical History:  Procedure Laterality Date  . ORIF WRIST FRACTURE Left 01/13/2020   Procedure: OPEN REDUCTION INTERNAL FIXATION (ORIF), left distal radius;  Surgeon: Corky Mull, MD;  Location: [facility] ORS;  Service: Orthopedics;  Laterality: Left;    There were no vitals filed for this visit.   Subjective Assessment - 02/26/20 0918    Subjective  Went back to work - did okay - little sore did wear my splint all day    Pertinent History Pt fell off hoveboard on 01/10/20 and had ORIF of distal radius fx by Dr Roland Rack on 01/13/20 - ulnar styloid fx but stable-follow up in 01/27/20 xray show great aligment and this date sterristrip still in place on volar scar- refer to OT    Patient Stated Goals Want to be able to get my motion and strength back in my L dominant hand and wrist so I can use it at home and get back to work using computer, phone, write and pull parts that were ordered    Currently in  Pain? Yes    Pain Score 2     Pain Location Wrist    Pain Orientation Left    Pain Descriptors / Indicators Aching;Tightness    Pain Type Acute pain;Surgical pain              [facility] OT Assessment - 02/26/20 0001      AROM   Left Forearm Supination 55 Degrees    Left Wrist Extension 45 Degrees    Left Wrist Flexion 68 Degrees                    OT Treatments/Exercises (OP) - 02/26/20 0001      LUE Fluidotherapy   Number Minutes Fluidotherapy 8 Minutes    LUE Fluidotherapy Location Wrist    Comments AROM in wrist and forearm            pt to  Cont do double time AAROM for ext for every flexion    progress in scar tissue , wrist flexion this date  Little more sore and stiff for sup and extention   Review with pt scar massage and done by OT- used mini massager this dateand xtractor- graston tool nr 2 sweeping on volar forearm   Using cica scar pad  night time   AAROM for Thumb PA and RAand composite flexion 10 reps each Increase AROM with edema decrease Opposition to all digits -slide down 5th -able to get to base of 5th   Tendon glides in fluido  - AROMand some AAROM for MC flexion to 90 - WNL   Cont at home AAROM over edge of table for RD, UD, flexion , extention - pain free-focus on wrist ext and flexion - not putting weight thru palm Prolonged sup stretch 2 min - x 3 and then supination wheel 20 reps - increase to 65 degrees   Upgrade last time 1 lbs weight for wristRD, UD and flexion, ext , FOR sup and pronation do wheel after stretch for 2 min  Can do now 2 sets of 12 with  1 lbs weight 12 reps - 2 x day pain free Cont   light 1/2 lbs weightlast time to supination wheel for sup, pronation - 20 reps- can do just sup for first 15 and then pron and sup last 15 reps CPM for wrist extention 2 x 200 sec increase to 50  2-3 x day  Stop with all HEP when feeling slight pull         OT Education - 02/26/20 0919    Education  Details progress and changes to HEP    Person(s) Educated Patient    Methods Explanation;Demonstration;Tactile cues;Verbal cues;Handout    Comprehension Verbal cues required;Returned demonstration;Verbalized understanding            OT Short Term Goals - 02/05/20 1020      OT SHORT TERM GOAL #1   Title Pt to be independent in HEP to decrease edema and increase AROM in digits to WNL and initiate scar massage    Baseline no knowledge on HEP , increase edema, not able to make composite fist , sterri strips still in place    Time 2    Period Weeks    Status New    Target Date 02/19/20             OT Long Term Goals - 02/05/20 1021      OT LONG TERM GOAL #1   Title L wrist and forearm extention and supination increase with more than 40 degrees to turn doorknob and reach wrist in to sleeve    Baseline wrist ext 15, sup 15 - functional use of L hand /wrist on PRWHE 44/50    Time 4    Period Weeks    Status New    Target Date 03/04/20      OT LONG TERM GOAL #2   Title L wrist AROM in all planes increase to [facility] for pt to increase use on PRWHE by 25 points and  return to work    Baseline AROM flexion 40, ext and sup 15, RD, UD 12, pro 75 - function on PRWHE 44/50    Time 6    Period Weeks    Status New    Target Date 03/18/20      OT LONG TERM GOAL #3   Title L grip and prehension strength increase to more than 60% compare to R hand to carry parts more than 5lbs, hold plate and return to work    Baseline NT - pt is 3 1/2 wks s/p    Time 7    Period Weeks    Status New    Target Date 03/25/20      OT LONG TERM GOAL #4  Title L wrist strength in all planes increase to 4+/5 to push door open, move chair , carry parts more than 5 lbs, turn doorknob    Baseline 3 1/2 wks s/p - only AROM at this moment- functional use PRWHE 44/50    Time 7    Period Weeks    Status New    Target Date 03/25/20                 Plan - 02/26/20 2542    Clinical Impression Statement Pt  is about 6 1/2 wks s/p L distal radius fx with ORIF - pt return to work - little  sore and stiff today  -but increase sup in session to 65 , and initiated CPM for wrist extention- doing well with 1 lbs weight and ROM - will add putty next week    OT Occupational Profile and History Problem Focused Assessment - Including review of records relating to presenting problem    Occupational performance deficits (Please refer to evaluation for details): ADL's;IADL's;Work;Play;Leisure;Social Participation    Body Structure / Function / Physical Skills ADL;Edema;Dexterity;Decreased knowledge of precautions;Flexibility;ROM;UE functional use;Scar mobility;FMC;Strength;Pain;IADL    Rehab Potential Good    Clinical Decision Making Limited treatment options, no task modification necessary    Comorbidities Affecting Occupational Performance: May have comorbidities impacting occupational performance    Modification or Assistance to Complete Evaluation  No modification of tasks or assist necessary to complete eval    OT Frequency 2x / week    OT Duration 6 weeks    OT Treatment/Interventions Self-care/ADL training;Paraffin;Fluidtherapy;Contrast Bath;Therapeutic exercise;Patient/family education;Splinting;Scar mobilization;Passive range of motion;Manual Therapy    Plan assess progress and upgrade HEP as needed    OT Home Exercise Plan see pt instruction    Consulted and Agree with Plan of Care Patient           Patient will benefit from skilled therapeutic intervention in order to improve the following deficits and impairments:   Body Structure / Function / Physical Skills: ADL,Edema,Dexterity,Decreased knowledge of precautions,Flexibility,ROM,UE functional use,Scar mobility,FMC,Strength,Pain,IADL       Visit Diagnosis: Stiffness of left wrist, not elsewhere classified  Stiffness of left hand, not elsewhere classified  Scar condition and fibrosis of skin  Muscle weakness (generalized)  Localized  edema    Problem List There are no problems to display for this patient.   Rosalyn Gess OTR/L,CLT 02/26/2020, 9:46 AM  [place] [facility] [address] [place], [address] Phone: [phone]   Fax:  [phone]  Name: ESTIL VALLEE MRN: [id number] Date of Birth: 30-Apr-1975

## 2020-03-02 ENCOUNTER — Other Ambulatory Visit: Payer: Self-pay

## 2020-03-02 ENCOUNTER — Ambulatory Visit: Payer: 59 | Admitting: Occupational Therapy

## 2020-03-02 ENCOUNTER — Encounter: Payer: Self-pay | Admitting: Occupational Therapy

## 2020-03-02 DIAGNOSIS — M25632 Stiffness of left wrist, not elsewhere classified: Secondary | ICD-10-CM

## 2020-03-02 DIAGNOSIS — L905 Scar conditions and fibrosis of skin: Secondary | ICD-10-CM

## 2020-03-02 DIAGNOSIS — M25642 Stiffness of left hand, not elsewhere classified: Secondary | ICD-10-CM | POA: Diagnosis not present

## 2020-03-02 DIAGNOSIS — R600 Localized edema: Secondary | ICD-10-CM

## 2020-03-02 DIAGNOSIS — M6281 Muscle weakness (generalized): Secondary | ICD-10-CM

## 2020-03-02 NOTE — Therapy (Signed)
[place] [facility] [address] [place], [address] Phone: [phone]   Fax:  [phone]  Occupational Therapy Treatment  Patient Details  Name: Tyler Kennedy MRN: [id number] Date of Birth: [birth year] Referring Provider (OT): Dr Leslye Peer and Cameron Proud   Encounter Date: 03/02/2020   OT End of Session - 03/03/20 1552    Visit Number 8    Number of Visits 12    Date for OT Re-Evaluation 03/25/20    OT Start Time 0933    OT Stop Time 1020    OT Time Calculation (min) 47 min    Activity Tolerance Patient tolerated treatment well    Behavior During Therapy [facility] for tasks assessed/performed           Past Medical History:  Diagnosis Date  . History of dysplastic nevus 06/06/2017   L scapula, moderate to severe atypia, excised 10/16/2017  . Hx of dysplastic nevus 10/23/2017   R mid back lateral 11cm lat to spine, mild atypia  . Hx of dysplastic nevus 10/23/2017   R low back lat flank, 15.0cm lat to spine, mild atypia    Past Surgical History:  Procedure Laterality Date  . ORIF WRIST FRACTURE Left 01/13/2020   Procedure: OPEN REDUCTION INTERNAL FIXATION (ORIF), left distal radius;  Surgeon: Corky Mull, MD;  Location: [facility] ORS;  Service: Orthopedics;  Laterality: Left;    There were no vitals filed for this visit.   Subjective Assessment - 03/03/20 1552    Subjective  Pt reports he went back to work last week a couple days for lighter duty.  Doing well, arm was sore this weekend but has been doing well with work related tasks since being back    Pertinent History Pt fell off hoveboard on 01/10/20 and had ORIF of distal radius fx by Dr Roland Rack on 01/13/20 - ulnar styloid fx but stable-follow up in 01/27/20 xray show great aligment and this date sterristrip still in place on volar scar- refer to OT    Patient Stated Goals Want to be able to get my motion and strength back in my L dominant hand and wrist so I can use it  at home and get back to work using computer, phone, 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 No/denies    Pain Score 0-No pain          Pt reports he is continuing to use cica scar pad night time   Paraffin to L hand and forearm as outlined in flowsheet, please refer.  Moist heat applied as well for short periods on and off for 8 mins  Scar massage performed L UE to mobilize the tissue, soften and flatten area and prevent adherence of scar tissue to other structures.   AAROM for Thumb PA and RAand composite flexion 10 reps each Opposition to all digits -slide down 5th -able to get to base of 5th   Tendon glides - AROMand some AAROM for MC flexion to 90 - WNL  AAROM over edge of table for RD, UD, flexion , extension - pain free-focus on wrist ext and flexion Prolonged sup stretch 2 min - x 3 and then supination wheel 20 reps  1 lbs weight for wristRD, UD and flexion, ext , FOR sup and pronation do wheel after stretch for 2 min  2 sets of 15 with 1# weight  light 1/2 lbs weight to supination wheel for sup,  pronation - 20 reps- can do just sup for first 15 and then pron and sup last 15 reps Continue at home for 2 sets of 15 reps              OT Treatments/Exercises (OP) - 03/04/20 0001      LUE Paraffin   Number Minutes Paraffin 8 Minutes    LUE Paraffin Location Hand;Forearm                  OT Education - 03/03/20 1552    Education Details HEP    Person(s) Educated Patient    Methods Explanation;Demonstration;Tactile cues;Verbal cues;Handout    Comprehension Verbal cues required;Returned demonstration;Verbalized understanding            OT Short Term Goals - 02/05/20 1020      OT SHORT TERM GOAL #1   Title Pt to be independent in HEP to decrease edema and increase AROM in digits to WNL and initiate scar massage    Baseline no knowledge on HEP , increase edema, not able to make composite fist , sterri strips still in place     Time 2    Period Weeks    Status New    Target Date 02/19/20             OT Long Term Goals - 02/05/20 1021      OT LONG TERM GOAL #1   Title L wrist and forearm extention and supination increase with more than 40 degrees to turn doorknob and reach wrist in to sleeve    Baseline wrist ext 15, sup 15 - functional use of L hand /wrist on PRWHE 44/50    Time 4    Period Weeks    Status New    Target Date 03/04/20      OT LONG TERM GOAL #2   Title L wrist AROM in all planes increase to [facility] for pt to increase use on PRWHE by 25 points and  return to work    Baseline AROM flexion 40, ext and sup 15, RD, UD 12, pro 75 - function on PRWHE 44/50    Time 6    Period Weeks    Status New    Target Date 03/18/20      OT LONG TERM GOAL #3   Title L grip and prehension strength increase to more than 60% compare to R hand to carry parts more than 5lbs, hold plate and return to work    Baseline NT - pt is 3 1/2 wks s/p    Time 7    Period Weeks    Status New    Target Date 03/25/20      OT LONG TERM GOAL #4   Title L wrist strength in all planes increase to 4+/5 to push door open, move chair , carry parts more than 5 lbs, turn doorknob    Baseline 3 1/2 wks s/p - only AROM at this moment- functional use PRWHE 44/50    Time 7    Period Weeks    Status New    Target Date 03/25/20                 Plan - 03/03/20 1553    Clinical Impression Statement Pt 7 weeks s/p L distal radius fx with ORIF, continues to progress with exercises.  Tolerated paraffin and heat well prior to exercises.  Scar progressing well and pt able to demonstrate understanding of scar massage and use  of CICA care.  Pt to perform 2 times extension than flexion, increased supination exs to 2 sets of 15, cues for use of supination wheel for correct form and technique.  Continue OT towards goals in plan of care to maximize safety and independence in necessary daily tasks.    OT Occupational Profile and History  Problem Focused Assessment - Including review of records relating to presenting problem    Occupational performance deficits (Please refer to evaluation for details): ADL's;IADL's;Work;Play;Leisure;Social Participation    Body Structure / Function / Physical Skills ADL;Edema;Dexterity;Decreased knowledge of precautions;Flexibility;ROM;UE functional use;Scar mobility;FMC;Strength;Pain;IADL    Rehab Potential Good    Clinical Decision Making Limited treatment options, no task modification necessary    Comorbidities Affecting Occupational Performance: May have comorbidities impacting occupational performance    Modification or Assistance to Complete Evaluation  No modification of tasks or assist necessary to complete eval    OT Frequency 2x / week    OT Duration 6 weeks    OT Treatment/Interventions Self-care/ADL training;Paraffin;Fluidtherapy;Contrast Bath;Therapeutic exercise;Patient/family education;Splinting;Scar mobilization;Passive range of motion;Manual Therapy    Plan assess progress and upgrade HEP as needed    OT Home Exercise Plan see pt instruction    Consulted and Agree with Plan of Care Patient           Patient will benefit from skilled therapeutic intervention in order to improve the following deficits and impairments:   Body Structure / Function / Physical Skills: ADL,Edema,Dexterity,Decreased knowledge of precautions,Flexibility,ROM,UE functional use,Scar mobility,FMC,Strength,Pain,IADL       Visit Diagnosis: Stiffness of left wrist, not elsewhere classified  Stiffness of left hand, not elsewhere classified  Scar condition and fibrosis of skin  Muscle weakness (generalized)  Localized edema    Problem List There are no problems to display for this patient.  Abigayle Wilinski Oneita Jolly, OTR/L, CLT  Chaun Uemura 03/04/2020, 4:08 PM  [place] [facility] [address] [place], [address] Phone: [phone]   Fax:   [phone]  Name: Tyler Kennedy MRN: [id number] Date of Birth: 1975/04/14

## 2020-03-05 ENCOUNTER — Ambulatory Visit: Payer: 59 | Admitting: Occupational Therapy

## 2020-03-05 ENCOUNTER — Other Ambulatory Visit: Payer: Self-pay

## 2020-03-05 DIAGNOSIS — L905 Scar conditions and fibrosis of skin: Secondary | ICD-10-CM | POA: Diagnosis not present

## 2020-03-05 DIAGNOSIS — R600 Localized edema: Secondary | ICD-10-CM | POA: Diagnosis not present

## 2020-03-05 DIAGNOSIS — M6281 Muscle weakness (generalized): Secondary | ICD-10-CM | POA: Diagnosis not present

## 2020-03-05 DIAGNOSIS — M25642 Stiffness of left hand, not elsewhere classified: Secondary | ICD-10-CM | POA: Diagnosis not present

## 2020-03-05 DIAGNOSIS — M25632 Stiffness of left wrist, not elsewhere classified: Secondary | ICD-10-CM | POA: Diagnosis not present

## 2020-03-05 NOTE — Patient Instructions (Signed)
Pt reports he is continuing to use cica scar pad night time

## 2020-03-05 NOTE — Therapy (Signed)
[place] [facility] [address], [place], [address] Phone: [phone]   Fax:  [phone]  Occupational Therapy Treatment  Patient Details  Name: Tyler Kennedy MRN: [id number] Date of Birth: 11-Nov-1975 Referring Provider (OT): Dr Leslye Peer and Cameron Proud   Encounter Date: 03/05/2020   OT End of Session - 03/05/20 0747    Visit Number 9    Number of Visits 12    Date for OT Re-Evaluation 03/25/20    OT Start Time 0745    OT Stop Time 0830    OT Time Calculation (min) 45 min    Activity Tolerance Patient tolerated treatment well    Behavior During Therapy [facility] for tasks assessed/performed           Past Medical History:  Diagnosis Date  . History of dysplastic nevus 06/06/2017   L scapula, moderate to severe atypia, excised 10/16/2017  . Hx of dysplastic nevus 10/23/2017   R mid back lateral 11cm lat to spine, mild atypia  . Hx of dysplastic nevus 10/23/2017   R low back lat flank, 15.0cm lat to spine, mild atypia    Past Surgical History:  Procedure Laterality Date  . ORIF WRIST FRACTURE Left 01/13/2020   Procedure: OPEN REDUCTION INTERNAL FIXATION (ORIF), left distal radius;  Surgeon: Corky Mull, MD;  Location: [facility] ORS;  Service: Orthopedics;  Laterality: Left;    There were no vitals filed for this visit.   Subjective Assessment - 03/05/20 0748    Subjective  Keyboarding , writing , pull doors - little sore not to bad - can rotate better    Pertinent History Pt fell off hoveboard on 01/10/20 and had ORIF of distal radius fx by Dr Roland Rack on 01/13/20 - ulnar styloid fx but stable-follow up in 01/27/20 xray show great aligment and this date sterristrip still in place on volar scar- refer to OT    Patient Stated Goals Want to be able to get my motion and strength back in my L dominant hand and wrist so I can use it at home and get back to work using computer, phone, write and pull parts that were ordered     Currently in Pain? Yes    Pain Score 1     Pain Location Wrist    Pain Orientation Left    Pain Descriptors / Indicators Sore    Pain Type Surgical pain    Pain Onset More than a month ago    Pain Frequency Intermittent              [facility] OT Assessment - 03/05/20 0001      AROM   Left Forearm Pronation 85 Degrees    Left Forearm Supination 65 Degrees    Left Wrist Extension 50 Degrees    Left Wrist Flexion 75 Degrees      Strength   Right Hand Grip (lbs) 110    Right Hand Lateral Pinch 26 lbs    Right Hand 3 Point Pinch 28 lbs    Left Hand Grip (lbs) 50    Left Hand Lateral Pinch 17 lbs    Left Hand 3 Point Pinch 14 lbs            measurements taken - great progress        OT Treatments/Exercises (OP) - 03/05/20 0001      RUE Paraffin   Number Minutes Paraffin 8 Minutes    RUE Paraffin Location Wrist  Comments sup and wrist ext stretch           Paraffin to L hand and forearm done 2 min sup and wrist extention stretch - each 2 x prior to scar massage   Scar massage performed using manual , mini massager and xtractor to mobilize the tissue, soften and flatten area and prevent adherence of scar tissue to other structures.   Tendon glides - AROMand some AAROM for MC flexion to 90 - WNL  AAROM over edge of table for RD, UD, flexion , extension - pain free-focus on wrist ext and flexion at home after stretches  Prolonged sup and wrist extention stretch 2 min - x 2 add to HEP and AAROM by OT 10 x hold 5 sec end range   followed by supination wheel 20 reps BTE for CPM 200 sec wrist extention  2 lbs weight for wristRD, UD and flexion, ext(do not strain) , sup,pro supported  1 sets of 12 with 2 lbs weight  2 x day pain free   Teal med putty for grip, lat and 3 point grip - pain free 12 reps  2 x day          OT Education - 03/05/20 0747    Education Details progress and HEP changes    Person(s) Educated Patient    Methods  Explanation;Demonstration;Tactile cues;Verbal cues;Handout    Comprehension Verbal cues required;Returned demonstration;Verbalized understanding            OT Short Term Goals - 02/05/20 1020      OT SHORT TERM GOAL #1   Title Pt to be independent in HEP to decrease edema and increase AROM in digits to WNL and initiate scar massage    Baseline no knowledge on HEP , increase edema, not able to make composite fist , sterri strips still in place    Time 2    Period Weeks    Status New    Target Date 02/19/20             OT Long Term Goals - 02/05/20 1021      OT LONG TERM GOAL #1   Title L wrist and forearm extention and supination increase with more than 40 degrees to turn doorknob and reach wrist in to sleeve    Baseline wrist ext 15, sup 15 - functional use of L hand /wrist on PRWHE 44/50    Time 4    Period Weeks    Status New    Target Date 03/04/20      OT LONG TERM GOAL #2   Title L wrist AROM in all planes increase to [facility] for pt to increase use on PRWHE by 25 points and  return to work    Baseline AROM flexion 40, ext and sup 15, RD, UD 12, pro 75 - function on PRWHE 44/50    Time 6    Period Weeks    Status New    Target Date 03/18/20      OT LONG TERM GOAL #3   Title L grip and prehension strength increase to more than 60% compare to R hand to carry parts more than 5lbs, hold plate and return to work    Baseline NT - pt is 3 1/2 wks s/p    Time 7    Period Weeks    Status New    Target Date 03/25/20      OT LONG TERM GOAL #4   Title L wrist strength  in all planes increase to 4+/5 to push door open, move chair , carry parts more than 5 lbs, turn doorknob    Baseline 3 1/2 wks s/p - only AROM at this moment- functional use PRWHE 44/50    Time 7    Period Weeks    Status New    Target Date 03/25/20                 Plan - 03/05/20 0748    Clinical Impression Statement Pt is 7 1/2 wks s/p L distal radius fx with ORIF - made great progress in sup ,  ext and flexion of L wrist- good strength in range - pain on ulnar side with sup and wrist ext- but progressing -able tincreaes to 2 lbs for wrist HEP and putty add to grip , lat and 3 point - did increase in all of them compare to about 2 wks    OT Occupational Profile and History Problem Focused Assessment - Including review of records relating to presenting problem    Occupational performance deficits (Please refer to evaluation for details): ADL's;IADL's;Work;Play;Leisure;Social Participation    Body Structure / Function / Physical Skills ADL;Edema;Dexterity;Decreased knowledge of precautions;Flexibility;ROM;UE functional use;Scar mobility;FMC;Strength;Pain;IADL    Rehab Potential Good    Clinical Decision Making Limited treatment options, no task modification necessary    Comorbidities Affecting Occupational Performance: May have comorbidities impacting occupational performance    Modification or Assistance to Complete Evaluation  No modification of tasks or assist necessary to complete eval    OT Frequency 2x / week    OT Duration 6 weeks    OT Treatment/Interventions Self-care/ADL training;Paraffin;Fluidtherapy;Contrast Bath;Therapeutic exercise;Patient/family education;Splinting;Scar mobilization;Passive range of motion;Manual Therapy    Plan assess progress and upgrade HEP as needed    OT Home Exercise Plan see pt instruction    Consulted and Agree with Plan of Care Patient           Patient will benefit from skilled therapeutic intervention in order to improve the following deficits and impairments:   Body Structure / Function / Physical Skills: ADL,Edema,Dexterity,Decreased knowledge of precautions,Flexibility,ROM,UE functional use,Scar mobility,FMC,Strength,Pain,IADL       Visit Diagnosis: Stiffness of left wrist, not elsewhere classified  Stiffness of left hand, not elsewhere classified  Scar condition and fibrosis of skin  Muscle weakness (generalized)  Localized  edema    Problem List There are no problems to display for this patient.   Rosalyn Gess OTR/l,CLT 03/05/2020, 8:33 AM  [place] [facility] [address] [place], [address] Phone: [phone]   Fax:  [phone]  Name: Tyler Kennedy MRN: [id number] Date of Birth: 1975-11-17

## 2020-03-08 ENCOUNTER — Other Ambulatory Visit: Payer: Self-pay

## 2020-03-08 ENCOUNTER — Ambulatory Visit: Payer: 59 | Admitting: Occupational Therapy

## 2020-03-08 DIAGNOSIS — M25632 Stiffness of left wrist, not elsewhere classified: Secondary | ICD-10-CM

## 2020-03-08 DIAGNOSIS — R600 Localized edema: Secondary | ICD-10-CM

## 2020-03-08 DIAGNOSIS — M25642 Stiffness of left hand, not elsewhere classified: Secondary | ICD-10-CM | POA: Diagnosis not present

## 2020-03-08 DIAGNOSIS — M6281 Muscle weakness (generalized): Secondary | ICD-10-CM

## 2020-03-08 DIAGNOSIS — L905 Scar conditions and fibrosis of skin: Secondary | ICD-10-CM | POA: Diagnosis not present

## 2020-03-08 NOTE — Therapy (Signed)
[facility] [address] [place], [address] Phone: [phone]   Fax:  [phone]  Occupational Therapy Treatment/progress note 10th visit   Patient Details  Name: Tyler Kennedy MRN: [id number] Date of Birth: March 25, 1975 Referring Provider (OT): Dr Leslye Peer and Cameron Proud   Encounter Date: 03/08/2020   OT End of Session - 03/08/20 0835    Visit Number 10    Number of Visits 12    Date for OT Re-Evaluation 03/25/20    OT Start Time 0820    OT Stop Time 0853    OT Time Calculation (min) 33 min    Activity Tolerance Patient tolerated treatment well    Behavior During Therapy [facility] for tasks assessed/performed           Past Medical History:  Diagnosis Date  . History of dysplastic nevus 06/06/2017   L scapula, moderate to severe atypia, excised 10/16/2017  . Hx of dysplastic nevus 10/23/2017   R mid back lateral 11cm lat to spine, mild atypia  . Hx of dysplastic nevus 10/23/2017   R low back lat flank, 15.0cm lat to spine, mild atypia    Past Surgical History:  Procedure Laterality Date  . ORIF WRIST FRACTURE Left 01/13/2020   Procedure: OPEN REDUCTION INTERNAL FIXATION (ORIF), left distal radius;  Surgeon: Corky Mull, MD;  Location: [facility] ORS;  Service: Orthopedics;  Laterality: Left;    There were no vitals filed for this visit.   Subjective Assessment - 03/08/20 0833    Subjective  Playing video game - finger got sore after about 2 hrs - using more - more strength - still hard to open jar or bottle pushing up    Pertinent History Pt fell off hoveboard on 01/10/20 and had ORIF of distal radius fx by Dr Roland Rack on 01/13/20 - ulnar styloid fx but stable-follow up in 01/27/20 xray show great aligment and this date sterristrip still in place on volar scar- refer to OT    Patient Stated Goals Want to be able to get my motion and strength back in my L dominant hand and wrist so I can use it at home and get back  to work using computer, phone, 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 Yes    Pain Score 1     Pain Location Wrist    Pain Orientation Left    Pain Descriptors / Indicators Sore    Pain Type Surgical pain    Pain Onset More than a month ago    Pain Frequency Intermittent              [facility] OT Assessment - 03/08/20 0001      AROM   Left Forearm Supination 65 Degrees   70 after few reps   Left Wrist Extension 50 Degrees                    OT Treatments/Exercises (OP) - 03/08/20 0001      RUE Paraffin   Number Minutes Paraffin 8 Minutes    RUE Paraffin Location Wrist    Comments sup and wrist ext stretch 2 min each ,x 2            Paraffin to L hand and forearm done 2 min sup and wrist extention stretch - each 2 x prior to scar massage   Scar massage performed using manual , mini massager and xtractor  to mobilize the tissue, soften and flatten area and prevent adherence of scar tissue to other structures. New cica scar pad provided   Tendon glides - AROMand some AAROM for MC flexion to 90 - WNL  AAROM over edge of table for RD, UD, flexion , extension - pain free-focus on wrist ext and flexion at home after stretches to cont Prolonged sup and wrist extention stretch 2 min - x 2 add to HEP and AAROM by OT 10 x hold 5 sec end range supination   followed by supination wheel 20 reps - pt to do open had to get further Range and change with HEP BTE for CPM 200 sec wrist extention  And then table slides done 20 reps - add to HEP   2 lbs weight for wristRD, UD and flexion, ext(do not strain) , sup,pro supported  1 sets of 12 with 2 lbs weight  2 x day pain free  Cont - and now issues   Teal med putty for grip, lat and 3 point grip - pain free 12 reps  2 x day  Assess grip and prehesion next session   Increase functional use per pt - PRWHE function improved from 44 to 30 /50- mostly weight carrying and lifting issue still and weight  bearing -           OT Education - 03/08/20 0835    Education Details progress and HEP changes    Person(s) Educated Patient    Methods Explanation;Demonstration;Tactile cues;Verbal cues;Handout    Comprehension Verbal cues required;Returned demonstration;Verbalized understanding            OT Short Term Goals - 03/08/20 0859      OT SHORT TERM GOAL #1   Title Pt to be independent in HEP to decrease edema and increase AROM in digits to WNL and initiate scar massage    Status Achieved             OT Long Term Goals - 03/08/20 0900      OT LONG TERM GOAL #1   Title L wrist and forearm extention and supination increase with more than 40 degrees to turn doorknob and reach wrist in to sleeve    Baseline wrist ext 15, sup 15 - functional use of L hand /wrist on PRWHE 44/50- NOW ext 50 , sup 65 and PRWHE function 30    Time 4    Period Weeks    Status On-going    Target Date 03/29/20      OT LONG TERM GOAL #2   Title L wrist AROM in all planes increase to [facility] for pt to increase use on PRWHE by 25 points and  return to work    Baseline AROM flexion 40, ext and sup 15, RD, UD 12, pro 75 - function on PRWHE 44/50-=NOW improving RD, UD WFL , ext 50 to 58 in session, sup 65, PRWHE function 30/50    Time 2    Period Weeks    Status On-going    Target Date 03/18/20      OT LONG TERM GOAL #3   Title L grip and prehension strength increase to more than 60% compare to R hand to carry parts more than 5lbs, hold plate and return to work    Baseline WIll assess next visit    Period Weeks    Status On-going    Target Date 03/25/20      OT LONG TERM GOAL #4   Title  L wrist strength in all planes increase to 4+/5 to push door open, move chair , carry parts more than 5 lbs, turn doorknob    Baseline able to use 2 lbs for wrist - but still hard for ext and sup unsupported - assess carry weight next visit    Time 3    Period Weeks    Status On-going    Target Date 03/25/20                  Plan - 03/08/20 0847    Clinical Impression Statement Pt is 8 wks s./p L distal radius fx ORIF - pt cont to make progress in scar adhesions, AROM and strength in wrist - cont to focus on AROM for wrist ext and sup - 58 ext in session today , sup 65 -70 degrees- scar still adhere distally - but able to use hand more functionally - improve on PRWHE for function from 44 to 30 /50 - pt can benefit from cont OT services    OT Occupational Profile and History Problem Focused Assessment - Including review of records relating to presenting problem    Occupational performance deficits (Please refer to evaluation for details): ADL's;IADL's;Work;Play;Leisure;Social Participation    Body Structure / Function / Physical Skills ADL;Edema;Dexterity;Decreased knowledge of precautions;Flexibility;ROM;UE functional use;Scar mobility;FMC;Strength;Pain;IADL    Rehab Potential Good    Clinical Decision Making Limited treatment options, no task modification necessary    Comorbidities Affecting Occupational Performance: May have comorbidities impacting occupational performance    Modification or Assistance to Complete Evaluation  No modification of tasks or assist necessary to complete eval    OT Frequency 2x / week    OT Duration 6 weeks    OT Treatment/Interventions Self-care/ADL training;Paraffin;Fluidtherapy;Contrast Bath;Therapeutic exercise;Patient/family education;Splinting;Scar mobilization;Passive range of motion;Manual Therapy    Plan assess progress and upgrade HEP as needed    OT Home Exercise Plan see pt instruction    Consulted and Agree with Plan of Care Patient           Patient will benefit from skilled therapeutic intervention in order to improve the following deficits and impairments:   Body Structure / Function / Physical Skills: ADL,Edema,Dexterity,Decreased knowledge of precautions,Flexibility,ROM,UE functional use,Scar mobility,FMC,Strength,Pain,IADL       Visit  Diagnosis: Stiffness of left wrist, not elsewhere classified  Stiffness of left hand, not elsewhere classified  Scar condition and fibrosis of skin  Muscle weakness (generalized)  Localized edema    Problem List There are no problems to display for this patient.   Rosalyn Gess OTR/L,CLT 03/08/2020, 9:03 AM  [place] [facility] [address] [place], [address] Phone: [phone]   Fax:  [phone]  Name: Tyler Kennedy MRN: [id number] Date of Birth: 1975/03/04

## 2020-03-11 ENCOUNTER — Ambulatory Visit: Payer: 59 | Attending: Student | Admitting: Occupational Therapy

## 2020-03-11 ENCOUNTER — Other Ambulatory Visit: Payer: Self-pay

## 2020-03-11 DIAGNOSIS — R600 Localized edema: Secondary | ICD-10-CM | POA: Diagnosis not present

## 2020-03-11 DIAGNOSIS — M25642 Stiffness of left hand, not elsewhere classified: Secondary | ICD-10-CM | POA: Diagnosis not present

## 2020-03-11 DIAGNOSIS — M6281 Muscle weakness (generalized): Secondary | ICD-10-CM | POA: Diagnosis not present

## 2020-03-11 DIAGNOSIS — L905 Scar conditions and fibrosis of skin: Secondary | ICD-10-CM | POA: Insufficient documentation

## 2020-03-11 DIAGNOSIS — M25632 Stiffness of left wrist, not elsewhere classified: Secondary | ICD-10-CM | POA: Diagnosis not present

## 2020-03-11 NOTE — Therapy (Signed)
[place] [facility] [address] [place], [address] Phone: [phone]   Fax:  [phone]  Occupational Therapy Treatment  Patient Details  Name: Tyler Kennedy MRN: [id number] Date of Birth: 04/07/1975 Referring Provider (OT): Dr Leslye Peer and Cameron Proud   Encounter Date: 03/11/2020   OT End of Session - 03/11/20 1323    Visit Number 11    Number of Visits 12    Date for OT Re-Evaluation 03/25/20    OT Start Time 1249    OT Stop Time 1329    OT Time Calculation (min) 40 min    Activity Tolerance Patient tolerated treatment well    Behavior During Therapy [facility] for tasks assessed/performed           Past Medical History:  Diagnosis Date  . History of dysplastic nevus 06/06/2017   L scapula, moderate to severe atypia, excised 10/16/2017  . Hx of dysplastic nevus 10/23/2017   R mid back lateral 11cm lat to spine, mild atypia  . Hx of dysplastic nevus 10/23/2017   R low back lat flank, 15.0cm lat to spine, mild atypia    Past Surgical History:  Procedure Laterality Date  . ORIF WRIST FRACTURE Left 01/13/2020   Procedure: OPEN REDUCTION INTERNAL FIXATION (ORIF), left distal radius;  Surgeon: Corky Mull, MD;  Location: [facility] ORS;  Service: Orthopedics;  Laterality: Left;    There were no vitals filed for this visit.   Subjective Assessment - 03/11/20 1322    Subjective  DOing better- using it more - no pain and able to lift , open objects - no pushing up yet    Pertinent History Pt fell off hoveboard on 01/10/20 and had ORIF of distal radius fx by Dr Roland Rack on 01/13/20 - ulnar styloid fx but stable-follow up in 01/27/20 xray show great aligment and this date sterristrip still in place on volar scar- refer to OT    Patient Stated Goals Want to be able to get my motion and strength back in my L dominant hand and wrist so I can use it at home and get back to work using computer, phone, write and pull parts that were  ordered    Currently in Pain? No/denies              [facility] OT Assessment - 03/11/20 0001      AROM   Left Forearm Supination 75 Degrees    Left Wrist Extension 60 Degrees    Left Wrist Flexion 80 Degrees      Strength   Left Hand Grip (lbs) 55    Left Hand Lateral Pinch 21 lbs    Left Hand 3 Point Pinch 19 lbs           Measurement taken see flow sheet - great progress in AROM for wrist and forearm  And grip and prehension improve - pt report putty still feel good amount of resistance           OT Treatments/Exercises (OP) - 03/11/20 0001      RUE Paraffin   Number Minutes Paraffin 8 Minutes    RUE Paraffin Location Wrist    Comments sup and wrist ext stretch 2 x 2 min           Paraffin to L hand and forearm done 2 min sup and wrist extention stretch - each 2 x prior to scar massage   Scar massage performed using manual , mini  massager and xtractor to mobilize the tissue, soften and flatten area and prevent adherence of scar tissue to other structures. New cica scar pad provided   Pt to cont with   AAROM over edge of table for RD, UD, flexion , extension - pain free-focus on wrist ext and flexion at home after stretches to cont AAROM by OT 10 x hold 5 sec end range supination  followed by supination wheel 20 reps - pt to do open had to get further Range and change with HEP BTE for CPM 200 sec wrist extention  And then table slides done 20 reps - and cont at home  2 lbs weight for wristRD, UD and flexion, ext(do not strain) , sup,pro supported but focus on end range sup 2 sets of 12 with 2 lbs weight  2 x day pain free fpr 3 days - then increase to 3rd set if pain free  Teal med putty for grip, lat and 3 point grip - pain free 12 reps 2 sets  2 x day  Increase in 3 days - 3rd set   I          OT Education - 03/11/20 1323    Education Details progress and HEP changes    Person(s) Educated Patient    Methods  Explanation;Demonstration;Tactile cues;Verbal cues;Handout    Comprehension Verbal cues required;Returned demonstration;Verbalized understanding            OT Short Term Goals - 03/08/20 0859      OT SHORT TERM GOAL #1   Title Pt to be independent in HEP to decrease edema and increase AROM in digits to WNL and initiate scar massage    Status Achieved             OT Long Term Goals - 03/08/20 0900      OT LONG TERM GOAL #1   Title L wrist and forearm extention and supination increase with more than 40 degrees to turn doorknob and reach wrist in to sleeve    Baseline wrist ext 15, sup 15 - functional use of L hand /wrist on PRWHE 44/50- NOW ext 50 , sup 65 and PRWHE function 30    Time 4    Period Weeks    Status On-going    Target Date 03/29/20      OT LONG TERM GOAL #2   Title L wrist AROM in all planes increase to [facility] for pt to increase use on PRWHE by 25 points and  return to work    Baseline AROM flexion 40, ext and sup 15, RD, UD 12, pro 75 - function on PRWHE 44/50-=NOW improving RD, UD WFL , ext 50 to 58 in session, sup 65, PRWHE function 30/50    Time 2    Period Weeks    Status On-going    Target Date 03/18/20      OT LONG TERM GOAL #3   Title L grip and prehension strength increase to more than 60% compare to R hand to carry parts more than 5lbs, hold plate and return to work    Baseline WIll assess next visit    Period Weeks    Status On-going    Target Date 03/25/20      OT LONG TERM GOAL #4   Title L wrist strength in all planes increase to 4+/5 to push door open, move chair , carry parts more than 5 lbs, turn doorknob    Baseline able to use 2 lbs  for wrist - but still hard for ext and sup unsupported - assess carry weight next visit    Time 3    Period Weeks    Status On-going    Target Date 03/25/20                 Plan - 03/11/20 1324    Clinical Impression Statement Pt is 8 1//2 wks s/p L distal radius fx ORIF - pt volar scar tissue  adhesions improving and showed increase sup, wrist ext and flexion - doing well with HEP at home - and increase reps and sets for teal med putty and 2 lbs weight for wrist - Grip and prehension strength in L hand increase too - pt decrease to 1 x wk    OT Occupational Profile and History Problem Focused Assessment - Including review of records relating to presenting problem    Occupational performance deficits (Please refer to evaluation for details): ADL's;IADL's;Work;Play;Leisure;Social Participation    Body Structure / Function / Physical Skills ADL;Edema;Dexterity;Decreased knowledge of precautions;Flexibility;ROM;UE functional use;Scar mobility;FMC;Strength;Pain;IADL    Rehab Potential Good    Clinical Decision Making Limited treatment options, no task modification necessary    Comorbidities Affecting Occupational Performance: May have comorbidities impacting occupational performance    Modification or Assistance to Complete Evaluation  No modification of tasks or assist necessary to complete eval    OT Frequency 1x / week    OT Duration 6 weeks    OT Treatment/Interventions Self-care/ADL training;Paraffin;Fluidtherapy;Contrast Bath;Therapeutic exercise;Patient/family education;Splinting;Scar mobilization;Passive range of motion;Manual Therapy    Plan assess progress and upgrade HEP as needed    OT Home Exercise Plan see pt instruction    Consulted and Agree with Plan of Care Patient           Patient will benefit from skilled therapeutic intervention in order to improve the following deficits and impairments:   Body Structure / Function / Physical Skills: ADL,Edema,Dexterity,Decreased knowledge of precautions,Flexibility,ROM,UE functional use,Scar mobility,FMC,Strength,Pain,IADL       Visit Diagnosis: Stiffness of left wrist, not elsewhere classified  Stiffness of left hand, not elsewhere classified  Scar condition and fibrosis of skin  Muscle weakness (generalized)  Localized  edema    Problem List There are no problems to display for this patient.   Rosalyn Gess OTR/L,CLT 03/11/2020, 4:25 PM  [place] [facility] [address], [place], [address] Phone: [phone]   Fax:  [phone]  Name: Tyler Kennedy MRN: [id number] Date of Birth: 02-17-1975

## 2020-03-18 ENCOUNTER — Other Ambulatory Visit: Payer: Self-pay

## 2020-03-18 ENCOUNTER — Ambulatory Visit: Payer: 59 | Admitting: Occupational Therapy

## 2020-03-18 DIAGNOSIS — M6281 Muscle weakness (generalized): Secondary | ICD-10-CM | POA: Diagnosis not present

## 2020-03-18 DIAGNOSIS — L905 Scar conditions and fibrosis of skin: Secondary | ICD-10-CM

## 2020-03-18 DIAGNOSIS — R600 Localized edema: Secondary | ICD-10-CM

## 2020-03-18 DIAGNOSIS — M25632 Stiffness of left wrist, not elsewhere classified: Secondary | ICD-10-CM

## 2020-03-18 DIAGNOSIS — M25642 Stiffness of left hand, not elsewhere classified: Secondary | ICD-10-CM | POA: Diagnosis not present

## 2020-03-18 NOTE — Therapy (Signed)
[facility] [address] [place], [address] Phone: [phone]   Fax:  [phone]  Occupational Therapy Treatment  Patient Details  Name: Tyler Kennedy MRN: [id number] Date of Birth: 07-27-1975 Referring Provider (OT): Dr Leslye Peer and Cameron Proud   Encounter Date: 03/18/2020   OT End of Session - 03/18/20 0743    Visit Number 12    Number of Visits 16    Date for OT Re-Evaluation 04/15/20    OT Start Time 0730    OT Stop Time 0809    OT Time Calculation (min) 39 min    Activity Tolerance Patient tolerated treatment well    Behavior During Therapy [facility] for tasks assessed/performed           Past Medical History:  Diagnosis Date  . History of dysplastic nevus 06/06/2017   L scapula, moderate to severe atypia, excised 10/16/2017  . Hx of dysplastic nevus 10/23/2017   R mid back lateral 11cm lat to spine, mild atypia  . Hx of dysplastic nevus 10/23/2017   R low back lat flank, 15.0cm lat to spine, mild atypia    Past Surgical History:  Procedure Laterality Date  . ORIF WRIST FRACTURE Left 01/13/2020   Procedure: OPEN REDUCTION INTERNAL FIXATION (ORIF), left distal radius;  Surgeon: Corky Mull, MD;  Location: [facility] ORS;  Service: Orthopedics;  Laterality: Left;    There were no vitals filed for this visit.   Subjective Assessment - 03/18/20 0730    Subjective  Using it more- opening doors - staying around under 8 lbs picking up - Writing better- 2 lbs doing okay    Pertinent History Pt fell off hoveboard on 01/10/20 and had ORIF of distal radius fx by Dr Roland Rack on 01/13/20 - ulnar styloid fx but stable-follow up in 01/27/20 xray show great aligment and this date sterristrip still in place on volar scar- refer to OT    Patient Stated Goals Want to be able to get my motion and strength back in my L dominant hand and wrist so I can use it at home and get back to work using computer, phone, write and pull parts  that were ordered    Currently in Pain? No/denies              [facility] OT Assessment - 03/18/20 0001      AROM   Left Forearm Pronation 90 Degrees    Left Forearm Supination 75 Degrees    Left Wrist Extension 60 Degrees    Left Wrist Flexion 86 Degrees      Strength   Left Hand Grip (lbs) 66    Left Hand Lateral Pinch 21 lbs    Left Hand 3 Point Pinch 19 lbs           wrist flexion increase -and grip increase - see flowsheet          OT Treatments/Exercises (OP) - 03/18/20 0001      LUE Paraffin   Number Minutes Paraffin 8 Minutes    LUE Paraffin Location Forearm;Wrist    Comments wrist ext, sup stretch 2 min each            Pt to cont with   AAROM over edge of table for RD, UD, flexion , extension - pain free-focus on wrist ext and flexion at home after stretchesto cont AAROM by OT 10 x hold 5 sec end rangesupination followed by supination wheel 20 reps-  pt to do open had to get further Range and change with HEP Reinforce doing during day -randomly prayer stretch BTE for CPM 200 sec wrist extention And then table slides done 20 reps - and cont at home  2 lbs weight for wristRD, UD and flexion, ext(do not strain) , sup,pro supported but focus on end range sup and relax into it  Increase to 3 sets of 12 with 2 lbs weight  2 x day pain free  Upgrade to green medium firm putty for grip, lat and 3 point grip - pain free 12 reps  Decrease to 1 set 2 x day Increase in 3 days - 2nd set ; 5 days 3rd set if pain free         OT Education - 03/18/20 0743    Education Details progress and HEP changes    Person(s) Educated Patient    Methods Explanation;Demonstration;Tactile cues;Verbal cues;Handout    Comprehension Verbal cues required;Returned demonstration;Verbalized understanding            OT Short Term Goals - 03/18/20 0745      OT SHORT TERM GOAL #1   Title Pt to be independent in HEP to decrease edema and increase AROM in digits to  WNL and initiate scar massage    Status Achieved             OT Long Term Goals - 03/18/20 0745      OT LONG TERM GOAL #1   Title L wrist and forearm extention and supination increase with more than 40 degrees to turn doorknob and reach wrist in to sleeve    Baseline wrist ext 15, sup 15 - functional use of L hand /wrist on PRWHE 44/50- NOW ext 55 , sup 75 and PRWHE function 30    Time 4    Period Weeks    Status On-going    Target Date 03/29/20      OT LONG TERM GOAL #2   Title L wrist AROM in all planes increase to [facility] for pt to increase use on PRWHE by 25 points and  return to work    Baseline AROM flexion 40, ext and sup 15, RD, UD 12, pro 75 - function on PRWHE 44/50-=NOW improving RD, UD WFL , ext 50 to 60 in session, sup 75, PRWHE function 30/50    Time 3    Period Weeks    Status On-going    Target Date 04/08/20      OT LONG TERM GOAL #3   Title L grip and prehension strength increase to more than 60% compare to R hand to carry parts more than 5lbs, hold plate and return to work    Baseline Grip improving great - grip 66 lbs L , R 110 lbs - carry about 8 lbs - slight pull -    Time 4    Period Weeks    Target Date 04/15/20      OT LONG TERM GOAL #4   Title L wrist strength in all planes increase to 4+/5 to push door open, move chair , carry parts more than 5 lbs, turn doorknob    Baseline Able to do all tasks -and carry about 8 lbs -and lift 7 on and off shelves    Status Achieved                 Plan - 03/18/20 0744    Clinical Impression Statement Pt is 9 1/2 wks s/p L distal  radius fx ORIF - pt volar scar adhesion improving and grip increase this date - sup and wrist extention about same - but htis appt was early in am -pt to focus on scar massage, stretches for sup and wrist extentoin - cont to increase sets and reps with 2 lbs weight for wrist pain free and upgrade putty to green med this date - but decrease to 1 sets - 2 x day and pt to increase sets this  coming week    OT Occupational Profile and History Problem Focused Assessment - Including review of records relating to presenting problem    Occupational performance deficits (Please refer to evaluation for details): ADL's;IADL's;Work;Play;Leisure;Social Participation    Body Structure / Function / Physical Skills ADL;Edema;Dexterity;Decreased knowledge of precautions;Flexibility;ROM;UE functional use;Scar mobility;FMC;Strength;Pain;IADL    Rehab Potential Good    Clinical Decision Making Limited treatment options, no task modification necessary    Comorbidities Affecting Occupational Performance: May have comorbidities impacting occupational performance    Modification or Assistance to Complete Evaluation  No modification of tasks or assist necessary to complete eval    OT Frequency 1x / week    OT Duration 4 weeks    OT Treatment/Interventions Self-care/ADL training;Paraffin;Fluidtherapy;Contrast Bath;Therapeutic exercise;Patient/family education;Splinting;Scar mobilization;Passive range of motion;Manual Therapy    Plan assess progress and upgrade HEP as needed    OT Home Exercise Plan see pt instruction    Consulted and Agree with Plan of Care Patient           Patient will benefit from skilled therapeutic intervention in order to improve the following deficits and impairments:   Body Structure / Function / Physical Skills: ADL,Edema,Dexterity,Decreased knowledge of precautions,Flexibility,ROM,UE functional use,Scar mobility,FMC,Strength,Pain,IADL       Visit Diagnosis: Stiffness of left wrist, not elsewhere classified  Stiffness of left hand, not elsewhere classified  Scar condition and fibrosis of skin  Muscle weakness (generalized)  Localized edema    Problem List There are no problems to display for this patient.   Rosalyn Gess OTR/L,CLT 03/18/2020, 8:13 AM  [place] [facility] [address], [place], [address] Phone: [phone]   Fax:  [phone]  Name: BURAK ZERBE MRN: [id number] Date of Birth: [birth year]

## 2020-03-18 NOTE — Patient Instructions (Signed)
Paraffin to L hand and forearm done 2 min sup and wrist extention stretch - each 2 x prior to scar massage   Scar massage performed using manual , mini massager and xtractor to mobilize the tissue, soften and flatten area and prevent adherence of scar tissue to other structures. -doing PROM wrist extentio n    I

## 2020-03-26 ENCOUNTER — Other Ambulatory Visit: Payer: Self-pay

## 2020-03-26 ENCOUNTER — Ambulatory Visit: Payer: 59 | Admitting: Occupational Therapy

## 2020-03-26 DIAGNOSIS — M25632 Stiffness of left wrist, not elsewhere classified: Secondary | ICD-10-CM | POA: Diagnosis not present

## 2020-03-26 DIAGNOSIS — M6281 Muscle weakness (generalized): Secondary | ICD-10-CM | POA: Diagnosis not present

## 2020-03-26 DIAGNOSIS — L905 Scar conditions and fibrosis of skin: Secondary | ICD-10-CM

## 2020-03-26 DIAGNOSIS — M25642 Stiffness of left hand, not elsewhere classified: Secondary | ICD-10-CM | POA: Diagnosis not present

## 2020-03-26 DIAGNOSIS — R600 Localized edema: Secondary | ICD-10-CM

## 2020-03-26 NOTE — Therapy (Signed)
[place] [facility] [address] [place], [address] Phone: [phone]   Fax:  [phone]  Occupational Therapy Treatment  Patient Details  Name: Tyler Kennedy MRN: [id number] Date of Birth: 09-02-1975 Referring Provider (OT): Dr Leslye Peer and Cameron Proud   Encounter Date: 03/26/2020   OT End of Session - 03/26/20 0959    Visit Number 13    Number of Visits 16    Date for OT Re-Evaluation 04/15/20    OT Start Time 0840    OT Stop Time 0912    OT Time Calculation (min) 32 min    Activity Tolerance Patient tolerated treatment well    Behavior During Therapy [facility] for tasks assessed/performed           Past Medical History:  Diagnosis Date  . History of dysplastic nevus 06/06/2017   L scapula, moderate to severe atypia, excised 10/16/2017  . Hx of dysplastic nevus 10/23/2017   R mid back lateral 11cm lat to spine, mild atypia  . Hx of dysplastic nevus 10/23/2017   R low back lat flank, 15.0cm lat to spine, mild atypia    Past Surgical History:  Procedure Laterality Date  . ORIF WRIST FRACTURE Left 01/13/2020   Procedure: OPEN REDUCTION INTERNAL FIXATION (ORIF), left distal radius;  Surgeon: Corky Mull, MD;  Location: [facility] ORS;  Service: Orthopedics;  Laterality: Left;    There were no vitals filed for this visit.   Subjective Assessment - 03/26/20 0957    Subjective  Using it more - can pick up gallon of milk- my dad was in the hospital - so was some what our of my routine - maybe did over do my putty -and held off your day or 2 - but now good    Pertinent History Pt fell off hoveboard on 01/10/20 and had ORIF of distal radius fx by Dr Roland Rack on 01/13/20 - ulnar styloid fx but stable-follow up in 01/27/20 xray show great aligment and this date sterristrip still in place on volar scar- refer to OT    Patient Stated Goals Want to be able to get my motion and strength back in my L dominant hand and wrist so I can use  it at home and get back to work using computer, phone, 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 No/denies              [facility] OT Assessment - 03/26/20 0001      AROM   Left Forearm Pronation 90 Degrees    Left Forearm Supination 80 Degrees    Right Wrist Extension 60 Degrees    Right Wrist Flexion 100 Degrees    Left Wrist Extension 60 Degrees    Left Wrist Flexion 85 Degrees      Strength   Right Hand Grip (lbs) 110    Right Hand Lateral Pinch 26 lbs    Right Hand 3 Point Pinch 28 lbs    Left Hand Grip (lbs) 70    Left Hand Lateral Pinch 22 lbs    Left Hand 3 Point Pinch 19 lbs          Pt report using hand more at work and at home - carrying gallon milk - push and pull doors Dad was in hospital and could not do HEP as should  taken ROM and grip/prehension strength- Wrist ext same as R -and flexion still decrease but [facility]  Supination improve -and grip improved   Pt able to carry, lift 10 lbs - no pain Pull and push heavy door open   Pt to focus on PROM and stretches for sup, wrist ext and flexion the next 2 wks     supination wheel 20 reps- pt to do open had to get further Range  And hold And PROM / stretch - hold 30 sec  Randomly prayer stretch for wrist extention prior to  table slides done 20 reps - few times during day - pain free Then follow up wall pushups -no issues- 12 reps  Can increase to 2nd set in 3 days - then later 3 sets - pain free   2 lbs weight for wristRD, UD and flexion, ext(do not strain) , sup,pro supportedbut focus on end range sup and relax into it   3sets of 12   every other day or 1  X day pain free  Cont with  green medium firm putty for grip, lat and 3 point grip - pain free 12 reps One time day or every other day - 2-3 sets pain free   Cont to increase functional use of L hand in daily activities                    OT Education - 03/26/20 0958    Education Details progress and HEP changes     Person(s) Educated Patient    Methods Explanation;Demonstration;Tactile cues;Verbal cues;Handout    Comprehension Verbal cues required;Returned demonstration;Verbalized understanding            OT Short Term Goals - 03/18/20 0745      OT SHORT TERM GOAL #1   Title Pt to be independent in HEP to decrease edema and increase AROM in digits to WNL and initiate scar massage    Status Achieved             OT Long Term Goals - 03/18/20 0745      OT LONG TERM GOAL #1   Title L wrist and forearm extention and supination increase with more than 40 degrees to turn doorknob and reach wrist in to sleeve    Baseline wrist ext 15, sup 15 - functional use of L hand /wrist on PRWHE 44/50- NOW ext 55 , sup 75 and PRWHE function 30    Time 4    Period Weeks    Status On-going    Target Date 03/29/20      OT LONG TERM GOAL #2   Title L wrist AROM in all planes increase to [facility] for pt to increase use on PRWHE by 25 points and  return to work    Baseline AROM flexion 40, ext and sup 15, RD, UD 12, pro 75 - function on PRWHE 44/50-=NOW improving RD, UD WFL , ext 50 to 60 in session, sup 75, PRWHE function 30/50    Time 3    Period Weeks    Status On-going    Target Date 04/08/20      OT LONG TERM GOAL #3   Title L grip and prehension strength increase to more than 60% compare to R hand to carry parts more than 5lbs, hold plate and return to work    Baseline Grip improving great - grip 66 lbs L , R 110 lbs - carry about 8 lbs - slight pull -    Time 4    Period Weeks    Target Date 04/15/20  OT LONG TERM GOAL #4   Title L wrist strength in all planes increase to 4+/5 to push door open, move chair , carry parts more than 5 lbs, turn doorknob    Baseline Able to do all tasks -and carry about 8 lbs -and lift 7 on and off shelves    Status Achieved                 Plan - 03/26/20 1000    Clinical Impression Statement PT is 10 1/2 wks s/p  L distal radius fx with ORIF - volar scar  adhesion improved greatly -pt with 60 degrees of ext - same as R - and flexion 85 - supination increase to 80 - pt to cont with HEP for supination wheel and weight- and grip increase to 70 lbs - R is 110 lbs - pt to cont with putty and 2 lbs weight every other day - but focus on PROM and table slides for wrist extention and add this date wall pushups - tolerate well - pt to cont HEP for 2 wks -was able to carry and lift 10 lbs without pain  , push and pull heavy door -    OT Occupational Profile and History Problem Focused Assessment - Including review of records relating to presenting problem    Occupational performance deficits (Please refer to evaluation for details): ADL's;IADL's;Work;Play;Leisure;Social Participation    Body Structure / Function / Physical Skills ADL;Edema;Dexterity;Decreased knowledge of precautions;Flexibility;ROM;UE functional use;Scar mobility;FMC;Strength;Pain;IADL    Rehab Potential Good    Clinical Decision Making Limited treatment options, no task modification necessary    Comorbidities Affecting Occupational Performance: May have comorbidities impacting occupational performance    Modification or Assistance to Complete Evaluation  No modification of tasks or assist necessary to complete eval    OT Frequency 1x / week    OT Duration 4 weeks    OT Treatment/Interventions Self-care/ADL training;Paraffin;Fluidtherapy;Contrast Bath;Therapeutic exercise;Patient/family education;Splinting;Scar mobilization;Passive range of motion;Manual Therapy    Plan assess progress and upgrade HEP as needed    OT Home Exercise Plan see pt instruction    Consulted and Agree with Plan of Care Patient           Patient will benefit from skilled therapeutic intervention in order to improve the following deficits and impairments:   Body Structure / Function / Physical Skills: ADL,Edema,Dexterity,Decreased knowledge of precautions,Flexibility,ROM,UE functional use,Scar  mobility,FMC,Strength,Pain,IADL       Visit Diagnosis: Stiffness of left wrist, not elsewhere classified  Stiffness of left hand, not elsewhere classified  Scar condition and fibrosis of skin  Muscle weakness (generalized)  Localized edema    Problem List There are no problems to display for this patient.   Rosalyn Gess OTR/L,CLT 03/26/2020, 2:14 PM  [place] [facility] [address], [place], [address] Phone: [phone]   Fax:  [phone]  Name: FREEMAN BORBA MRN: [id number] Date of Birth: 1975-05-27

## 2020-04-06 ENCOUNTER — Ambulatory Visit: Payer: 59 | Admitting: Occupational Therapy

## 2020-04-06 ENCOUNTER — Encounter: Payer: 59 | Admitting: Occupational Therapy

## 2020-04-09 ENCOUNTER — Encounter: Payer: 59 | Admitting: Occupational Therapy

## 2020-04-12 ENCOUNTER — Ambulatory Visit: Payer: 59 | Attending: Student | Admitting: Occupational Therapy

## 2020-04-12 ENCOUNTER — Other Ambulatory Visit: Payer: Self-pay

## 2020-04-12 DIAGNOSIS — L905 Scar conditions and fibrosis of skin: Secondary | ICD-10-CM | POA: Insufficient documentation

## 2020-04-12 DIAGNOSIS — M25632 Stiffness of left wrist, not elsewhere classified: Secondary | ICD-10-CM | POA: Insufficient documentation

## 2020-04-12 DIAGNOSIS — M6281 Muscle weakness (generalized): Secondary | ICD-10-CM | POA: Insufficient documentation

## 2020-04-12 DIAGNOSIS — R600 Localized edema: Secondary | ICD-10-CM | POA: Insufficient documentation

## 2020-04-12 DIAGNOSIS — M25642 Stiffness of left hand, not elsewhere classified: Secondary | ICD-10-CM | POA: Diagnosis not present

## 2020-04-12 NOTE — Therapy (Signed)
[place] [facility] [address] [place], [address] Phone: [phone]   Fax:  [phone]  Occupational Therapy Discharge  Patient Details  Name: Tyler Kennedy MRN: [id number] Date of Birth: 1975/05/17 Referring Provider (OT): Dr Leslye Peer and Cameron Proud   Encounter Date: 04/12/2020   OT End of Session - 04/12/20 0845    Visit Number 14    Number of Visits 14    Date for OT Re-Evaluation 04/12/20    OT Start Time 0815    OT Stop Time 0835    OT Time Calculation (min) 20 min    Activity Tolerance Patient tolerated treatment well    Behavior During Therapy [facility] for tasks assessed/performed           Past Medical History:  Diagnosis Date  . History of dysplastic nevus 06/06/2017   L scapula, moderate to severe atypia, excised 10/16/2017  . Hx of dysplastic nevus 10/23/2017   R mid back lateral 11cm lat to spine, mild atypia  . Hx of dysplastic nevus 10/23/2017   R low back lat flank, 15.0cm lat to spine, mild atypia    Past Surgical History:  Procedure Laterality Date  . ORIF WRIST FRACTURE Left 01/13/2020   Procedure: OPEN REDUCTION INTERNAL FIXATION (ORIF), left distal radius;  Surgeon: Corky Mull, MD;  Location: [facility] ORS;  Service: Orthopedics;  Laterality: Left;    There were no vitals filed for this visit.   Subjective Assessment - 04/12/20 0841    Subjective  Using my hand - back to about 80% use- push up from floow still cannot do - but I can pick up like car battery and no pain    Pertinent History Pt fell off hoveboard on 01/10/20 and had ORIF of distal radius fx by Dr Roland Rack on 01/13/20 - ulnar styloid fx but stable-follow up in 01/27/20 xray show great aligment and this date sterristrip still in place on volar scar- refer to OT    Patient Stated Goals Want to be able to get my motion and strength back in my L dominant hand and wrist so I can use it at home and get back to work using computer, phone,  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 No/denies              [facility] OT Assessment - 04/12/20 0001      AROM   Left Forearm Pronation 90 Degrees    Left Forearm Supination 80 Degrees    Right Wrist Extension 60 Degrees    Right Wrist Flexion 100 Degrees    Left Wrist Extension 60 Degrees    Left Wrist Flexion 105 Degrees      Strength   Right Hand Grip (lbs) 110    Right Hand Lateral Pinch 26 lbs    Right Hand 3 Point Pinch 28 lbs    Left Hand Grip (lbs) 82    Left Hand Lateral Pinch 24 lbs    Left Hand 3 Point Pinch 26 lbs            Pt made great progress from [facility] in Edema , scar tissue , pain ,ROM and strength Increase grip and prehension the last 2 wks - and strength in wrist in all planes 5/5 except supination   Pt able to push up from chair, wall pushup , push and pull heavy door , lift car battery at work  Lift and  carry 10 lbs in clinic no issues in L hand  Pt report no issues at home using hand and no pain  Pt to cont with stretches for end range supination and wrist extention with weight bearing - like table slide to prepare to be able to do push up from floor in future Review also supination stretch with pt  Pt discharge at this time                  OT Education - 04/12/20 0845    Education Details progress and discharge    Person(s) Educated Patient    Methods Explanation;Demonstration;Tactile cues;Verbal cues;Handout    Comprehension Verbal cues required;Returned demonstration;Verbalized understanding            OT Short Term Goals - 03/18/20 0745      OT SHORT TERM GOAL #1   Title Pt to be independent in HEP to decrease edema and increase AROM in digits to WNL and initiate scar massage    Status Achieved             OT Long Term Goals - 04/12/20 0848      OT LONG TERM GOAL #1   Title L wrist and forearm extention and supination increase with more than 40 degrees to turn doorknob and reach wrist in to  sleeve    Baseline wrist ext 15, sup 15 - functional use of L hand /wrist on PRWHE 44/50- NOW ext 65 , sup 90 and PRWHE function 2/50    Status Achieved      OT LONG TERM GOAL #2   Title L wrist AROM in all planes increase to [facility] for pt to increase use on PRWHE by 25 points and  return to work    Baseline AROM flexion 40, ext and sup 15, RD, UD 12, pro 75 - function on PRWHE 44/50-=NOW improving RD, UD WFL , ext 65 , sup 80, PRWHE function 2/50    Status Achieved      OT LONG TERM GOAL #3   Title L grip and prehension strength increase to more than 60% compare to R hand to carry parts more than 5lbs, hold plate and return to work    Baseline Grip improving great - grip 82 lbs L , R 110 lbs - carry more than 10 lbs and can pick up car battery - Lat and 3 point grip 24 and 26 lbs - more than 80%    Status Achieved      OT LONG TERM GOAL #4   Title L wrist strength in all planes increase to 4+/5 to push door open, move chair , carry parts more than 5 lbs, turn doorknob    Baseline Able to do all tasks -and carry and lift more than 10 lbs    Status Achieved                 Plan - 04/12/20 0846    Clinical Impression Statement Pt is about 3 months s/p L distal radius fx with ORIF - volar scar adhesion improved and pt show increase grip and prehension- AROM increase but still limited in end range wrist extention for weight bearing and supination - pt to cont stretches for wrist ext weight bearing and supination for few wks - but met all goals and discharge from OT services    OT Occupational Profile and History Problem Focused Assessment - Including review of records relating to presenting problem    Occupational performance  deficits (Please refer to evaluation for details): ADL's;IADL's;Work;Play;Leisure;Social Participation    Body Structure / Function / Physical Skills ADL;Edema;Dexterity;Decreased knowledge of precautions;Flexibility;ROM;UE functional use;Scar  mobility;FMC;Strength;Pain;IADL    Rehab Potential Good    Clinical Decision Making Limited treatment options, no task modification necessary    Comorbidities Affecting Occupational Performance: May have comorbidities impacting occupational performance    Modification or Assistance to Complete Evaluation  No modification of tasks or assist necessary to complete eval    OT Treatment/Interventions Self-care/ADL training;Paraffin;Fluidtherapy;Contrast Bath;Therapeutic exercise;Patient/family education;Splinting;Scar mobilization;Passive range of motion;Manual Therapy    Consulted and Agree with Plan of Care Patient           Patient will benefit from skilled therapeutic intervention in order to improve the following deficits and impairments:   Body Structure / Function / Physical Skills: ADL,Edema,Dexterity,Decreased knowledge of precautions,Flexibility,ROM,UE functional use,Scar mobility,FMC,Strength,Pain,IADL       Visit Diagnosis: Stiffness of left wrist, not elsewhere classified  Stiffness of left hand, not elsewhere classified  Scar condition and fibrosis of skin  Muscle weakness (generalized)  Localized edema    Problem List There are no problems to display for this patient.   Rosalyn Gess OTR/L,CLT 04/12/2020, 8:51 AM  [place] [facility] [address] [place], [address] Phone: [phone]   Fax:  [phone]  Name: DANTON PALMATEER MRN: [id number] Date of Birth: [birth year]

## 2020-04-16 ENCOUNTER — Other Ambulatory Visit: Payer: Self-pay

## 2020-04-16 MED ORDER — SERTRALINE HCL 100 MG PO TABS
ORAL_TABLET | ORAL | 3 refills | Status: AC
  Filled 2020-04-16: qty 90, 90d supply, fill #0

## 2020-04-17 ENCOUNTER — Other Ambulatory Visit: Payer: Self-pay

## 2020-04-19 ENCOUNTER — Other Ambulatory Visit: Payer: Self-pay

## 2020-04-19 MED ORDER — SERTRALINE HCL 100 MG PO TABS
ORAL_TABLET | ORAL | 0 refills | Status: AC
  Filled 2020-04-19: qty 90, 90d supply, fill #0

## 2020-05-10 DIAGNOSIS — I10 Essential (primary) hypertension: Secondary | ICD-10-CM | POA: Diagnosis not present

## 2020-05-10 DIAGNOSIS — E7800 Pure hypercholesterolemia, unspecified: Secondary | ICD-10-CM | POA: Diagnosis not present

## 2020-05-10 DIAGNOSIS — Z79899 Other long term (current) drug therapy: Secondary | ICD-10-CM | POA: Diagnosis not present

## 2020-05-10 DIAGNOSIS — Z125 Encounter for screening for malignant neoplasm of prostate: Secondary | ICD-10-CM | POA: Diagnosis not present

## 2020-05-12 DIAGNOSIS — E7800 Pure hypercholesterolemia, unspecified: Secondary | ICD-10-CM | POA: Diagnosis not present

## 2020-05-17 ENCOUNTER — Other Ambulatory Visit: Payer: Self-pay

## 2020-05-17 DIAGNOSIS — I10 Essential (primary) hypertension: Secondary | ICD-10-CM | POA: Diagnosis not present

## 2020-05-17 DIAGNOSIS — F419 Anxiety disorder, unspecified: Secondary | ICD-10-CM | POA: Diagnosis not present

## 2020-05-17 DIAGNOSIS — E7800 Pure hypercholesterolemia, unspecified: Secondary | ICD-10-CM | POA: Diagnosis not present

## 2020-05-17 DIAGNOSIS — F3341 Major depressive disorder, recurrent, in partial remission: Secondary | ICD-10-CM | POA: Diagnosis not present

## 2020-05-17 MED ORDER — SERTRALINE HCL 100 MG PO TABS
ORAL_TABLET | ORAL | 3 refills | Status: AC
  Filled 2020-05-17 – 2020-07-06 (×2): qty 90, 90d supply, fill #0
  Filled 2020-10-25: qty 90, 90d supply, fill #1
  Filled 2021-02-04: qty 90, 90d supply, fill #2
  Filled 2021-05-17: qty 90, 90d supply, fill #3

## 2020-05-17 MED ORDER — PRAVASTATIN SODIUM 40 MG PO TABS
ORAL_TABLET | ORAL | 3 refills | Status: DC
Start: 2020-05-17 — End: 2021-07-18
  Filled 2020-05-17: qty 90, 90d supply, fill #0
  Filled 2020-08-23: qty 90, 90d supply, fill #1
  Filled 2020-12-01: qty 90, 90d supply, fill #2
  Filled 2021-03-20 – 2021-03-21 (×2): qty 90, 90d supply, fill #3

## 2020-05-17 MED ORDER — PROPRANOLOL HCL 20 MG PO TABS
20.0000 mg | ORAL_TABLET | Freq: Two times a day (BID) | ORAL | 3 refills | Status: AC
  Filled 2020-05-17: qty 180, 90d supply, fill #0

## 2020-05-19 DIAGNOSIS — L03032 Cellulitis of left toe: Secondary | ICD-10-CM | POA: Diagnosis not present

## 2020-05-21 ENCOUNTER — Other Ambulatory Visit: Payer: Self-pay

## 2020-05-21 DIAGNOSIS — L600 Ingrowing nail: Secondary | ICD-10-CM | POA: Diagnosis not present

## 2020-05-21 DIAGNOSIS — L601 Onycholysis: Secondary | ICD-10-CM | POA: Diagnosis not present

## 2020-05-21 DIAGNOSIS — M79675 Pain in left toe(s): Secondary | ICD-10-CM | POA: Diagnosis not present

## 2020-05-21 DIAGNOSIS — L03032 Cellulitis of left toe: Secondary | ICD-10-CM | POA: Diagnosis not present

## 2020-05-21 MED ORDER — SULFAMETHOXAZOLE-TRIMETHOPRIM 800-160 MG PO TABS
ORAL_TABLET | ORAL | 0 refills | Status: AC
  Filled 2020-05-21: qty 20, 10d supply, fill #0

## 2020-05-31 DIAGNOSIS — L601 Onycholysis: Secondary | ICD-10-CM | POA: Diagnosis not present

## 2020-05-31 DIAGNOSIS — L600 Ingrowing nail: Secondary | ICD-10-CM | POA: Diagnosis not present

## 2020-05-31 DIAGNOSIS — M79674 Pain in right toe(s): Secondary | ICD-10-CM | POA: Diagnosis not present

## 2020-05-31 DIAGNOSIS — L03032 Cellulitis of left toe: Secondary | ICD-10-CM | POA: Diagnosis not present

## 2020-06-01 ENCOUNTER — Other Ambulatory Visit: Payer: Self-pay

## 2020-06-01 ENCOUNTER — Other Ambulatory Visit: Payer: Self-pay | Admitting: Internal Medicine

## 2020-06-01 MED ORDER — PRAVASTATIN SODIUM 40 MG PO TABS
ORAL_TABLET | Freq: Every day | ORAL | 3 refills | Status: AC
  Filled 2020-06-01: qty 90, 90d supply, fill #0

## 2020-06-02 ENCOUNTER — Other Ambulatory Visit: Payer: Self-pay

## 2020-06-04 ENCOUNTER — Other Ambulatory Visit: Payer: Self-pay

## 2020-06-14 DIAGNOSIS — M79675 Pain in left toe(s): Secondary | ICD-10-CM | POA: Diagnosis not present

## 2020-06-14 DIAGNOSIS — M79674 Pain in right toe(s): Secondary | ICD-10-CM | POA: Diagnosis not present

## 2020-06-14 DIAGNOSIS — L600 Ingrowing nail: Secondary | ICD-10-CM | POA: Diagnosis not present

## 2020-06-14 DIAGNOSIS — L601 Onycholysis: Secondary | ICD-10-CM | POA: Diagnosis not present

## 2020-07-06 ENCOUNTER — Other Ambulatory Visit: Payer: Self-pay

## 2020-07-06 MED FILL — Propranolol HCl Tab 10 MG: ORAL | 90 days supply | Qty: 180 | Fill #0 | Status: AC

## 2020-08-04 ENCOUNTER — Other Ambulatory Visit: Payer: Self-pay

## 2020-08-04 MED ORDER — LAGEVRIO 200 MG PO CAPS
ORAL_CAPSULE | ORAL | 0 refills | Status: AC
  Filled 2020-08-04: qty 40, 5d supply, fill #0

## 2020-08-17 ENCOUNTER — Other Ambulatory Visit: Payer: Self-pay

## 2020-08-23 ENCOUNTER — Other Ambulatory Visit: Payer: Self-pay

## 2020-10-07 ENCOUNTER — Ambulatory Visit (INDEPENDENT_AMBULATORY_CARE_PROVIDER_SITE_OTHER): Payer: BC Managed Care – PPO | Admitting: Dermatology

## 2020-10-07 ENCOUNTER — Other Ambulatory Visit: Payer: Self-pay

## 2020-10-07 DIAGNOSIS — Z86018 Personal history of other benign neoplasm: Secondary | ICD-10-CM

## 2020-10-07 DIAGNOSIS — L720 Epidermal cyst: Secondary | ICD-10-CM | POA: Diagnosis not present

## 2020-10-07 DIAGNOSIS — L718 Other rosacea: Secondary | ICD-10-CM

## 2020-10-07 DIAGNOSIS — D1800 Hemangioma unspecified site: Secondary | ICD-10-CM

## 2020-10-07 DIAGNOSIS — Z1283 Encounter for screening for malignant neoplasm of skin: Secondary | ICD-10-CM

## 2020-10-07 DIAGNOSIS — L918 Other hypertrophic disorders of the skin: Secondary | ICD-10-CM

## 2020-10-07 DIAGNOSIS — L578 Other skin changes due to chronic exposure to nonionizing radiation: Secondary | ICD-10-CM

## 2020-10-07 DIAGNOSIS — D229 Melanocytic nevi, unspecified: Secondary | ICD-10-CM

## 2020-10-07 DIAGNOSIS — L719 Rosacea, unspecified: Secondary | ICD-10-CM

## 2020-10-07 DIAGNOSIS — L814 Other melanin hyperpigmentation: Secondary | ICD-10-CM

## 2020-10-07 DIAGNOSIS — L821 Other seborrheic keratosis: Secondary | ICD-10-CM

## 2020-10-07 NOTE — Patient Instructions (Signed)

## 2020-10-07 NOTE — Progress Notes (Signed)
Follow-Up Visit   Subjective  Tyler Kennedy is a 45 y.o. male who presents for the following: Annual Exam (Patient here today for tbse. He reports no new concerns today at follow up. Patient has history of rosacea at face but is not bothered by redness. ). Patient here for full body skin exam and skin cancer screening.  The following portions of the chart were reviewed this encounter and updated as appropriate:  Tobacco  Allergies  Meds  Problems  Med Hx  Surg Hx  Fam Hx     Review of Systems: No other skin or systemic complaints except as noted in HPI or Assessment and Plan.  Objective  Well appearing patient in no apparent distress; mood and affect are within normal limits.  A full examination was performed including scalp, head, eyes, ears, nose, lips, neck, chest, axillae, abdomen, back, buttocks, bilateral upper extremities, bilateral lower extremities, hands, feet, fingers, toes, fingernails, and toenails. All findings within normal limits unless otherwise noted below.  face Pinkness at cheeks   Right Upper Eyelid Smooth white papule(s).   Assessment & Plan  Rosacea - erythro- telangiectasia type face Rosacea is a chronic progressive skin condition usually affecting the face of adults, causing redness and/or acne bumps. It is treatable but not curable. It sometimes affects the eyes (ocular rosacea) as well. It may respond to topical and/or systemic medication and can flare with stress, sun exposure, alcohol, exercise and some foods.  Daily application of broad spectrum spf 30+ sunscreen to face is recommended to reduce flares.  Patient deferred treatment at this time.   Suggest laser / BBL treatment to help with redness   Milia Right Upper Eyelid Chronic and persistent Benign, observe.  Discussed removal if becomes bothersome  Patient declines today.  Skin cancer screening Lentigines - Scattered tan macules - Due to sun exposure - Benign-appearing, observe -  Recommend daily broad spectrum sunscreen SPF 30+ to sun-exposed areas, reapply every 2 hours as needed. - Call for any changes  Seborrheic Keratoses - Stuck-on, waxy, tan-brown papules and/or plaques  - Benign-appearing - Discussed benign etiology and prognosis. - Observe - Call for any changes  Melanocytic Nevi - Tan-brown and/or pink-flesh-colored symmetric macules and papules - Benign appearing on exam today - Observation - Call clinic for new or changing moles - Recommend daily use of broad spectrum spf 30+ sunscreen to sun-exposed areas.   Acrochordons (Skin Tags) - Fleshy, skin-colored pedunculated papules at axilla  - Benign appearing.  - Observe. - If desired, they can be removed with an in office procedure that is not covered by insurance. - Please call the clinic if you notice any new or changing lesions.  Hemangiomas - Red papules - Discussed benign nature - Observe - Call for any changes  Actinic Damage - Chronic condition, secondary to cumulative UV/sun exposure - diffuse scaly erythematous macules with underlying dyspigmentation - Recommend daily broad spectrum sunscreen SPF 30+ to sun-exposed areas, reapply every 2 hours as needed.  - Staying in the shade or wearing long sleeves, sun glasses (UVA+UVB protection) and wide brim hats (4-inch brim around the entire circumference of the hat) are also recommended for sun protection.  - Call for new or changing lesions.  History of Dysplastic Nevi - No evidence of recurrence today multiple locations see history  - Recommend regular full body skin exams - Recommend daily broad spectrum sunscreen SPF 30+ to sun-exposed areas, reapply every 2 hours as needed.  - Call if any  new or changing lesions are noted between office visits  Skin cancer screening performed today.  Return in about 1 year (around 10/07/2021) for tbse. IRuthell Rummage, CMA, am acting as scribe for Sarina Ser, MD. Documentation: I have  reviewed the above documentation for accuracy and completeness, and I agree with the above.  Sarina Ser, MD

## 2020-10-08 ENCOUNTER — Encounter: Payer: Self-pay | Admitting: Dermatology

## 2020-10-25 ENCOUNTER — Other Ambulatory Visit: Payer: Self-pay

## 2020-12-01 ENCOUNTER — Other Ambulatory Visit: Payer: Self-pay

## 2021-01-18 ENCOUNTER — Ambulatory Visit: Payer: Self-pay | Attending: Internal Medicine

## 2021-01-18 ENCOUNTER — Other Ambulatory Visit: Payer: Self-pay

## 2021-01-18 DIAGNOSIS — Z23 Encounter for immunization: Secondary | ICD-10-CM

## 2021-01-18 MED ORDER — PFIZER COVID-19 VAC BIVALENT 30 MCG/0.3ML IM SUSP
INTRAMUSCULAR | 0 refills | Status: AC
  Filled 2021-01-18: qty 0.3, 1d supply, fill #0

## 2021-01-18 NOTE — Progress Notes (Signed)
° °  Covid-19 Vaccination Clinic  Name:  Tyler Kennedy    MRN: [id number] DOB: 1975/09/24  01/18/2021  [name] was observed post Covid-19 immunization for 15 minutes without incident. He was provided with Vaccine Information Sheet and instruction to access the V-Safe system.   [name] was instructed to call 911 with any severe reactions post vaccine: Difficulty breathing  Swelling of face and throat  A fast heartbeat  A bad rash all over body  Dizziness and weakness   Immunizations Administered     Name Date Dose VIS Date Route   Pfizer Covid-19 Vaccine Bivalent Booster 01/18/2021 11:05 AM 0.3 mL 09/08/2020 Intramuscular   Manufacturer: [place]   Lot: [id number]   [place]: [place], PharmD, MBA Clinical Acute Care Pharmacist

## 2021-02-04 ENCOUNTER — Other Ambulatory Visit: Payer: Self-pay

## 2021-03-10 ENCOUNTER — Other Ambulatory Visit: Payer: Self-pay

## 2021-03-10 MED ORDER — AMOXICILLIN 500 MG PO CAPS
ORAL_CAPSULE | ORAL | 0 refills | Status: AC
  Filled 2021-03-10: qty 30, 7d supply, fill #0

## 2021-03-21 ENCOUNTER — Other Ambulatory Visit: Payer: Self-pay

## 2021-05-17 ENCOUNTER — Other Ambulatory Visit: Payer: Self-pay

## 2021-05-19 ENCOUNTER — Other Ambulatory Visit: Payer: Self-pay

## 2021-05-20 ENCOUNTER — Other Ambulatory Visit: Payer: Self-pay

## 2021-05-20 MED ORDER — PROPRANOLOL HCL 10 MG PO TABS
10.0000 mg | ORAL_TABLET | Freq: Two times a day (BID) | ORAL | 3 refills | Status: AC
  Filled 2021-05-20: qty 180, 90d supply, fill #0

## 2021-05-23 ENCOUNTER — Other Ambulatory Visit: Payer: Self-pay

## 2021-07-18 ENCOUNTER — Other Ambulatory Visit: Payer: Self-pay

## 2021-07-18 MED ORDER — PRAVASTATIN SODIUM 40 MG PO TABS
ORAL_TABLET | ORAL | 3 refills | Status: AC
  Filled 2021-07-18: qty 90, 90d supply, fill #0
  Filled 2021-11-09: qty 90, 90d supply, fill #1
  Filled 2022-02-23: qty 90, 90d supply, fill #2
  Filled 2022-06-19: qty 90, 90d supply, fill #3

## 2021-08-19 ENCOUNTER — Other Ambulatory Visit: Payer: Self-pay

## 2021-08-19 MED ORDER — PROPRANOLOL HCL 20 MG PO TABS
20.0000 mg | ORAL_TABLET | Freq: Two times a day (BID) | ORAL | 3 refills | Status: AC
  Filled 2021-08-19: qty 180, 90d supply, fill #0

## 2021-08-19 MED ORDER — SERTRALINE HCL 100 MG PO TABS
ORAL_TABLET | ORAL | 3 refills | Status: DC
  Filled 2021-08-19: qty 90, 90d supply, fill #0
  Filled 2022-01-10: qty 90, 90d supply, fill #1
  Filled 2022-04-28: qty 90, 90d supply, fill #2

## 2021-09-06 ENCOUNTER — Other Ambulatory Visit: Payer: Self-pay

## 2021-10-13 ENCOUNTER — Encounter: Payer: Self-pay | Admitting: Dermatology

## 2021-10-13 ENCOUNTER — Ambulatory Visit: Payer: BC Managed Care – PPO | Admitting: Dermatology

## 2021-10-13 DIAGNOSIS — Z1283 Encounter for screening for malignant neoplasm of skin: Secondary | ICD-10-CM

## 2021-10-13 DIAGNOSIS — L821 Other seborrheic keratosis: Secondary | ICD-10-CM

## 2021-10-13 DIAGNOSIS — D1801 Hemangioma of skin and subcutaneous tissue: Secondary | ICD-10-CM

## 2021-10-13 DIAGNOSIS — L578 Other skin changes due to chronic exposure to nonionizing radiation: Secondary | ICD-10-CM | POA: Diagnosis not present

## 2021-10-13 DIAGNOSIS — Z86018 Personal history of other benign neoplasm: Secondary | ICD-10-CM

## 2021-10-13 DIAGNOSIS — D239 Other benign neoplasm of skin, unspecified: Secondary | ICD-10-CM

## 2021-10-13 DIAGNOSIS — L814 Other melanin hyperpigmentation: Secondary | ICD-10-CM | POA: Diagnosis not present

## 2021-10-13 DIAGNOSIS — D229 Melanocytic nevi, unspecified: Secondary | ICD-10-CM

## 2021-10-13 NOTE — Patient Instructions (Signed)
Recommend daily broad spectrum sunscreen SPF 30+ to sun-exposed areas, reapply every 2 hours as needed. Call for new or changing lesions.  Staying in the shade or wearing long sleeves, sun glasses (UVA+UVB protection) and wide brim hats (4-inch brim around the entire circumference of the hat) are also recommended for sun protection.    Melanoma ABCDEs  Melanoma is the most dangerous type of skin cancer, and is the leading cause of death from skin disease.  You are more likely to develop melanoma if you: Have light-colored skin, light-colored eyes, or red or blond hair Spend a lot of time in the sun Tan regularly, either outdoors or in a tanning bed Have had blistering sunburns, especially during childhood Have a close family member who has had a melanoma Have atypical moles or large birthmarks  Early detection of melanoma is key since treatment is typically straightforward and cure rates are extremely high if we catch it early.   The first sign of melanoma is often a change in a mole or a new dark spot.  The ABCDE system is a way of remembering the signs of melanoma.  A for asymmetry:  The two halves do not match. B for border:  The edges of the growth are irregular. C for color:  A mixture of colors are present instead of an even brown color. D for diameter:  Melanomas are usually (but not always) greater than 6mm - the size of a pencil eraser. E for evolution:  The spot keeps changing in size, shape, and color.  Please check your skin once per month between visits. You can use a small mirror in front and a large mirror behind you to keep an eye on the back side or your body.   If you see any new or changing lesions before your next follow-up, please call to schedule a visit.  Please continue daily skin protection including broad spectrum sunscreen SPF 30+ to sun-exposed areas, reapplying every 2 hours as needed when you're outdoors.   Staying in the shade or wearing long sleeves, sun  glasses (UVA+UVB protection) and wide brim hats (4-inch brim around the entire circumference of the hat) are also recommended for sun protection.     Due to recent changes in healthcare laws, you may see results of your pathology and/or laboratory studies on MyChart before the doctors have had a chance to review them. We understand that in some cases there may be results that are confusing or concerning to you. Please understand that not all results are received at the same time and often the doctors may need to interpret multiple results in order to provide you with the best plan of care or course of treatment. Therefore, we ask that you please give us 2 business days to thoroughly review all your results before contacting the office for clarification. Should we see a critical lab result, you will be contacted sooner.   If You Need Anything After Your Visit  If you have any questions or concerns for your doctor, please call our main line at [phone] and press option 4 to reach your doctor's medical assistant. If no one answers, please leave a voicemail as directed and we will return your call as soon as possible. Messages left after 4 pm will be answered the following business day.   You may also send us a message via MyChart. We typically respond to MyChart messages within 1-2 business days.  For prescription refills, please ask your pharmacy to contact   our office. Our fax number is [phone].  If you have an urgent issue when the clinic is closed that cannot wait until the next business day, you can [place] your doctor at the number below.    Please note that while we do our best to be available for urgent issues outside of office hours, we are not available 24/7.   If you have an urgent issue and are unable to reach us, you may choose to seek medical care at your doctor's office, retail clinic, urgent care center, or emergency room.  If you have a medical emergency, please immediately  call 911 or go to the emergency department.  Pager Numbers  - Dr. Kowalski: [phone]  - Dr. Moye: [phone]  - Dr. Stewart: [phone]  In the event of inclement weather, please call our main line at [phone] for an update on the status of any delays or closures.  Dermatology Medication Tips: Please keep the boxes that topical medications come in in order to help keep track of the instructions about where and how to use these. Pharmacies typically print the medication instructions only on the boxes and not directly on the medication tubes.   If your medication is too expensive, please contact our office at [phone] option 4 or send us a message through MyChart.   We are unable to tell what your co-pay for medications will be in advance as this is different depending on your insurance coverage. However, we may be able to find a substitute medication at lower cost or fill out paperwork to get insurance to cover a needed medication.   If a prior authorization is required to get your medication covered by your insurance company, please allow us 1-2 business days to complete this process.  Drug prices often vary depending on where the prescription is filled and some pharmacies may offer cheaper prices.  The website www.goodrx.com contains coupons for medications through different pharmacies. The prices here do not account for what the cost may be with help from insurance (it may be cheaper with your insurance), but the website can give you the price if you did not use any insurance.  - You can print the associated coupon and take it with your prescription to the pharmacy.  - You may also stop by our office during regular business hours and pick up a GoodRx coupon card.  - If you need your prescription sent electronically to a different pharmacy, notify our office through [facility] MyChart or by phone at [phone] option 4.     Si Usted Necesita Algo Despus de Su  Visita  Tambin puede enviarnos un mensaje a travs de MyChart. Por lo general respondemos a los mensajes de MyChart en el transcurso de 1 a 2 das hbiles.  Para renovar recetas, por favor pida a su farmacia que se ponga en contacto con nuestra oficina. Nuestro nmero de fax es el [phone].  Si tiene un asunto urgente cuando la clnica est cerrada y que no puede esperar hasta el siguiente da hbil, puede llamar/localizar a su doctor(a) al nmero que aparece a continuacin.   Por favor, tenga en cuenta que aunque hacemos todo lo posible para estar disponibles para asuntos urgentes fuera del horario de oficina, no estamos disponibles las 24 horas del da, los 7 das de la semana.   Si tiene un problema urgente y no puede comunicarse con nosotros, puede optar por buscar atencin mdica  en el consultorio de su doctor(a), en una clnica privada,   en un centro de atencin urgente o en una sala de emergencias.  Si tiene una emergencia mdica, por favor llame inmediatamente al 911 o vaya a la sala de emergencias.  Nmeros de bper  - Dr. Kowalski: [phone]  - Dra. Moye: [phone]  - Dra. Stewart: [phone]  En caso de inclemencias del tiempo, por favor llame a nuestra lnea principal al [phone] para una actualizacin sobre el estado de cualquier retraso o cierre.  Consejos para la medicacin en dermatologa: Por favor, guarde las cajas en las que vienen los medicamentos de uso tpico para ayudarle a seguir las instrucciones sobre dnde y cmo usarlos. Las farmacias generalmente imprimen las instrucciones del medicamento slo en las cajas y no directamente en los tubos del medicamento.   Si su medicamento es muy caro, por favor, pngase en contacto con nuestra oficina llamando al [phone] y presione la opcin 4 o envenos un mensaje a travs de MyChart.   No podemos decirle cul ser su copago por los medicamentos por adelantado ya que esto es diferente dependiendo de la  cobertura de su seguro. Sin embargo, es posible que podamos encontrar un medicamento sustituto a menor costo o llenar un formulario para que el seguro cubra el medicamento que se considera necesario.   Si se requiere una autorizacin previa para que su compaa de seguros cubra su medicamento, por favor permtanos de 1 a 2 das hbiles para completar este proceso.  Los precios de los medicamentos varan con frecuencia dependiendo del lugar de dnde se surte la receta y alguna farmacias pueden ofrecer precios ms baratos.  El sitio web www.goodrx.com tiene cupones para medicamentos de diferentes farmacias. Los precios aqu no tienen en cuenta lo que podra costar con la ayuda del seguro (puede ser ms barato con su seguro), pero el sitio web puede darle el precio si no utiliz ningn seguro.  - Puede imprimir el cupn correspondiente y llevarlo con su receta a la farmacia.  - Tambin puede pasar por nuestra oficina durante el horario de atencin regular y recoger una tarjeta de cupones de GoodRx.  - Si necesita que su receta se enve electrnicamente a una farmacia diferente, informe a nuestra oficina a travs de MyChart de [place] o por telfono llamando al [phone] y presione la opcin 4.  

## 2021-10-13 NOTE — Progress Notes (Signed)
   Follow-Up Visit   Subjective  Tyler Kennedy is a 46 y.o. male who presents for the following: Annual Exam (Hx of dysplastic nevi). The patient presents for Total-Body Skin Exam (TBSE) for skin cancer screening and mole check.  The patient has spots, moles and lesions to be evaluated, some may be new or changing and the patient has concerns that these could be cancer.  The following portions of the chart were reviewed this encounter and updated as appropriate:  Tobacco  Allergies  Meds  Problems  Med Hx  Surg Hx  Fam Hx     Review of Systems: No other skin or systemic complaints except as noted in HPI or Assessment and Plan.  Objective  Well appearing patient in no apparent distress; mood and affect are within normal limits.  A full examination was performed including scalp, head, eyes, ears, nose, lips, neck, chest, axillae, abdomen, back, buttocks, bilateral upper extremities, bilateral lower extremities, hands, feet, fingers, toes, fingernails, and toenails. All findings within normal limits unless otherwise noted below.   Assessment & Plan   History of Dysplastic Nevi. Back. 2019 - No evidence of recurrence today - Recommend regular full body skin exams - Recommend daily broad spectrum sunscreen SPF 30+ to sun-exposed areas, reapply every 2 hours as needed.  - Call if any new or changing lesions are noted between office visits   Lentigines - Scattered tan macules - Due to sun exposure - Benign-appearing, observe - Recommend daily broad spectrum sunscreen SPF 30+ to sun-exposed areas, reapply every 2 hours as needed. - Call for any changes  Seborrheic Keratoses - Stuck-on, waxy, tan-brown papules and/or plaques  - Benign-appearing - Discussed benign etiology and prognosis. - Observe - Call for any changes  Melanocytic Nevi - Tan-brown and/or pink-flesh-colored symmetric macules and papules - Benign appearing on exam today - Observation - Call clinic for new or  changing moles - Recommend daily use of broad spectrum spf 30+ sunscreen to sun-exposed areas.   Hemangiomas - Red papules - Discussed benign nature - Observe - Call for any changes  Actinic Damage - Chronic condition, secondary to cumulative UV/sun exposure - diffuse scaly erythematous macules with underlying dyspigmentation - Recommend daily broad spectrum sunscreen SPF 30+ to sun-exposed areas, reapply every 2 hours as needed.  - Staying in the shade or wearing long sleeves, sun glasses (UVA+UVB protection) and wide brim hats (4-inch brim around the entire circumference of the hat) are also recommended for sun protection.  - Call for new or changing lesions.  Skin cancer screening performed today.   Return in about 1 year (around 10/14/2022) for TBSE, HxDN.  I, Emelia Salisbury, CMA, am acting as scribe for Sarina Ser, MD. Documentation: I have reviewed the above documentation for accuracy and completeness, and I agree with the above.  Sarina Ser, MD

## 2021-10-18 ENCOUNTER — Encounter: Payer: Self-pay | Admitting: Dermatology

## 2021-11-09 ENCOUNTER — Other Ambulatory Visit: Payer: Self-pay

## 2021-12-15 ENCOUNTER — Other Ambulatory Visit: Payer: Self-pay

## 2021-12-15 MED ORDER — INFLUENZA VAC SPLIT QUAD 0.5 ML IM SUSY
PREFILLED_SYRINGE | INTRAMUSCULAR | 0 refills | Status: AC
  Filled 2021-12-15: qty 0.5, 1d supply, fill #0

## 2022-02-23 ENCOUNTER — Other Ambulatory Visit: Payer: Self-pay

## 2022-02-23 MED ORDER — SERTRALINE HCL 100 MG PO TABS: 100.0000 mg | ORAL_TABLET | Freq: Every day | ORAL | 3 refills | Status: AC

## 2022-02-23 MED ORDER — PROPRANOLOL HCL 20 MG PO TABS
20.0000 mg | ORAL_TABLET | Freq: Two times a day (BID) | ORAL | 3 refills | Status: DC
  Filled 2022-02-23: qty 180, 90d supply, fill #0

## 2022-02-23 MED ORDER — PRAVASTATIN SODIUM 40 MG PO TABS
40.0000 mg | ORAL_TABLET | Freq: Every day | ORAL | 3 refills | Status: AC
  Filled 2022-02-23 – 2022-10-20 (×2): qty 90, 90d supply, fill #0

## 2022-04-03 IMAGING — CR DG WRIST COMPLETE 3+V*L*
4 series · 4 of 4 positions shown · non-contrast
Comparison: None.

CLINICAL DATA: Fall

EXAM:
LEFT WRIST - COMPLETE 3+ VIEW

[wrist pa]
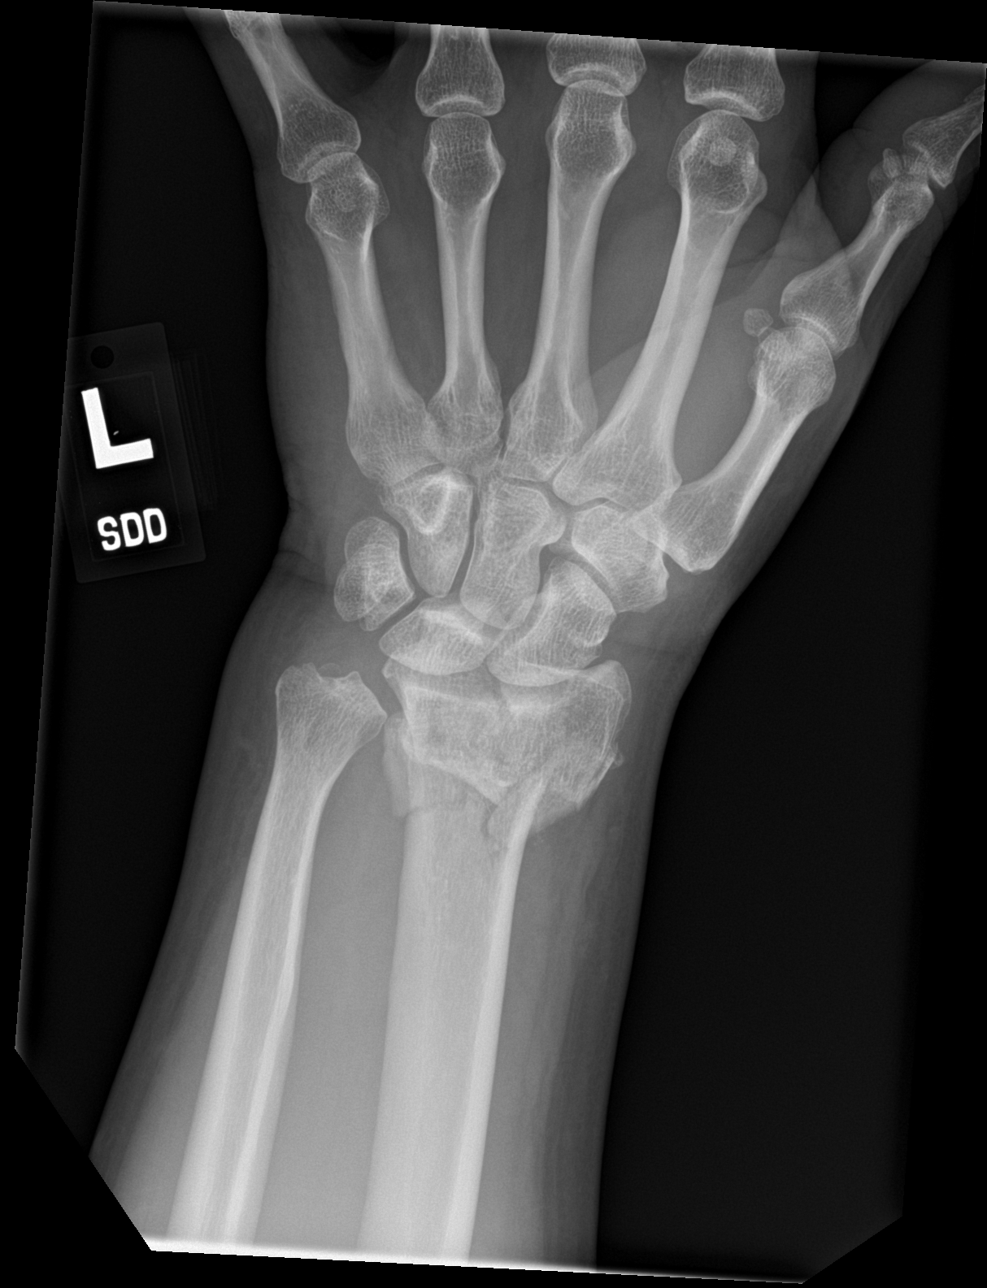

[wrist obl]
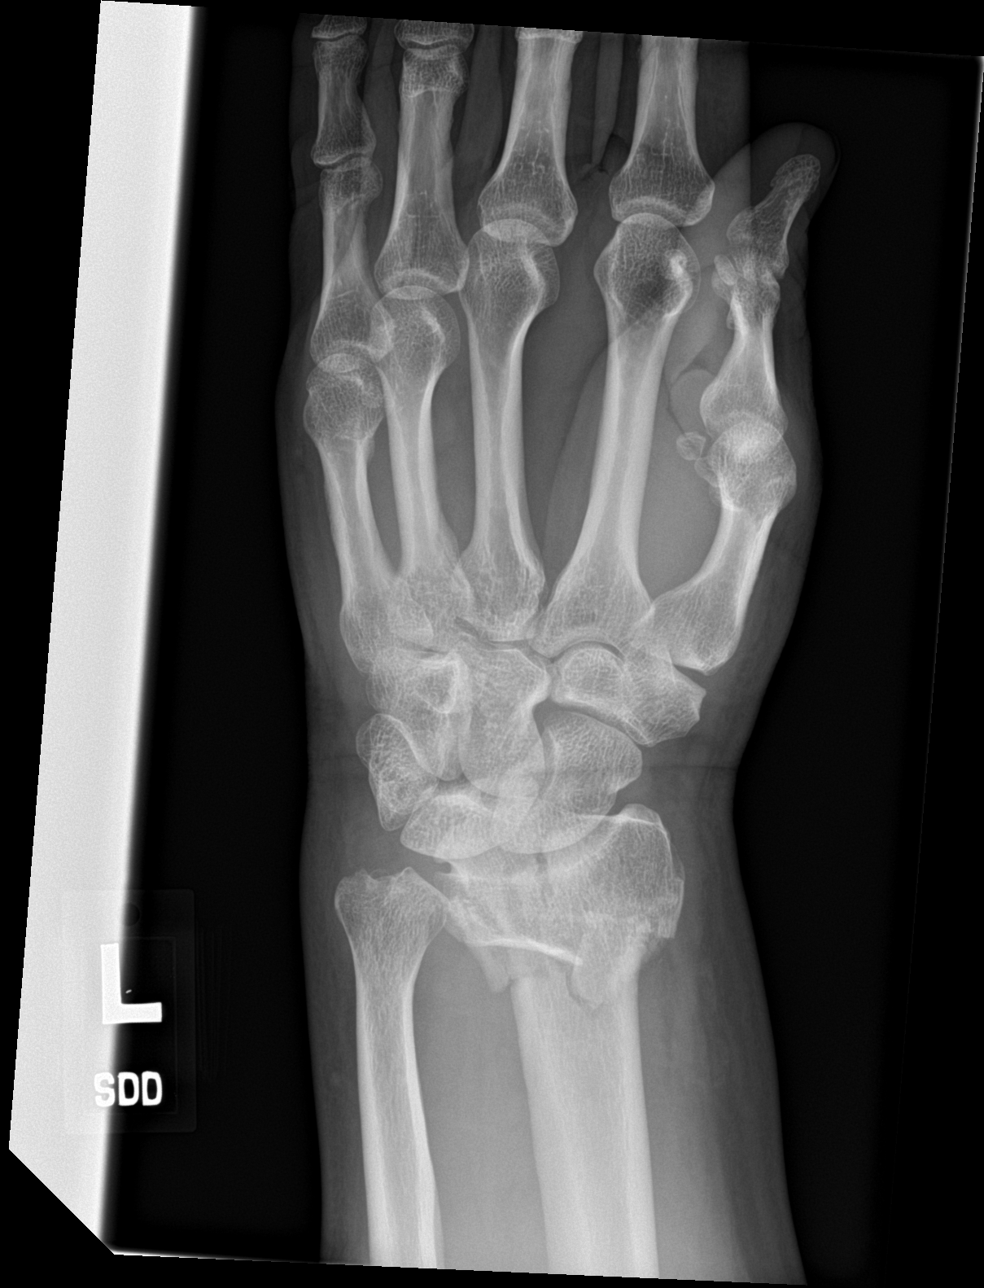

[wrist lat]
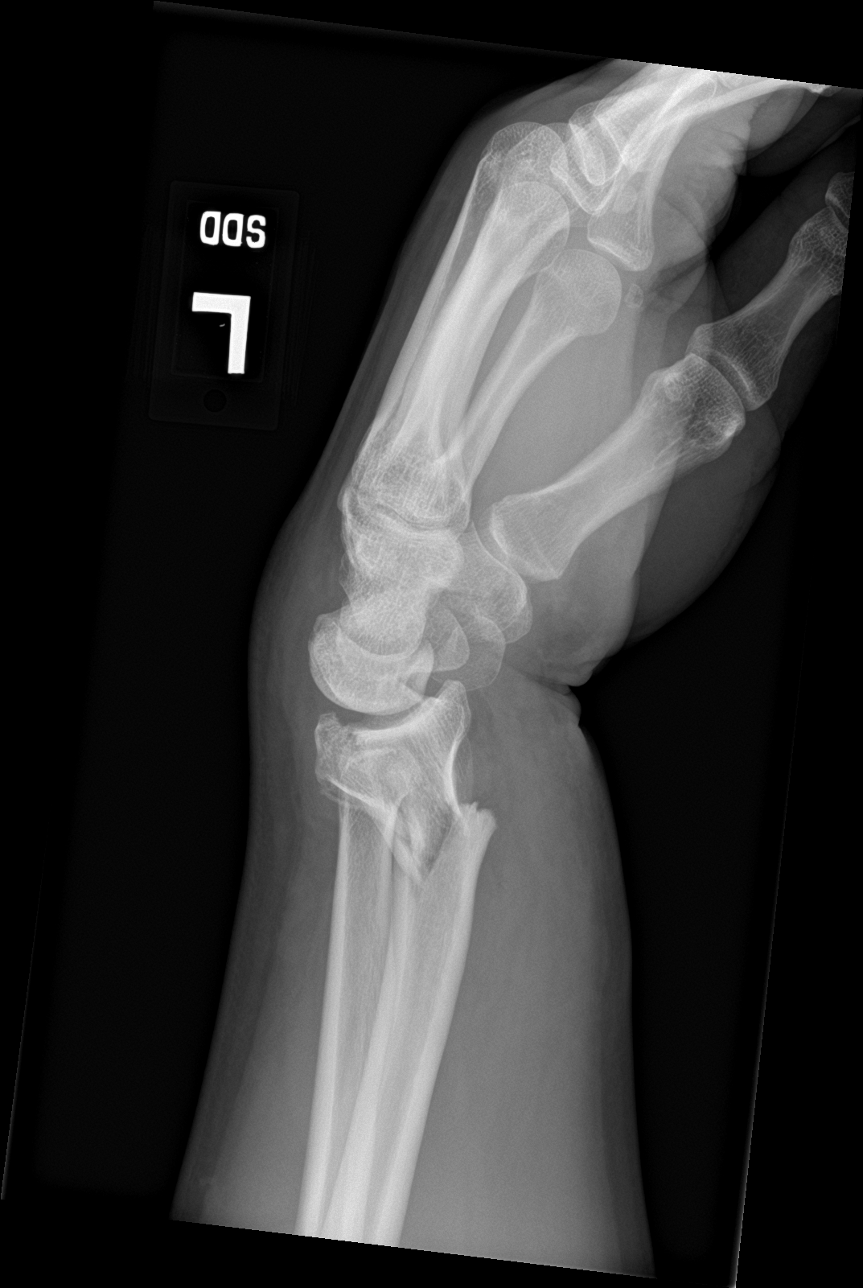

[navicular]
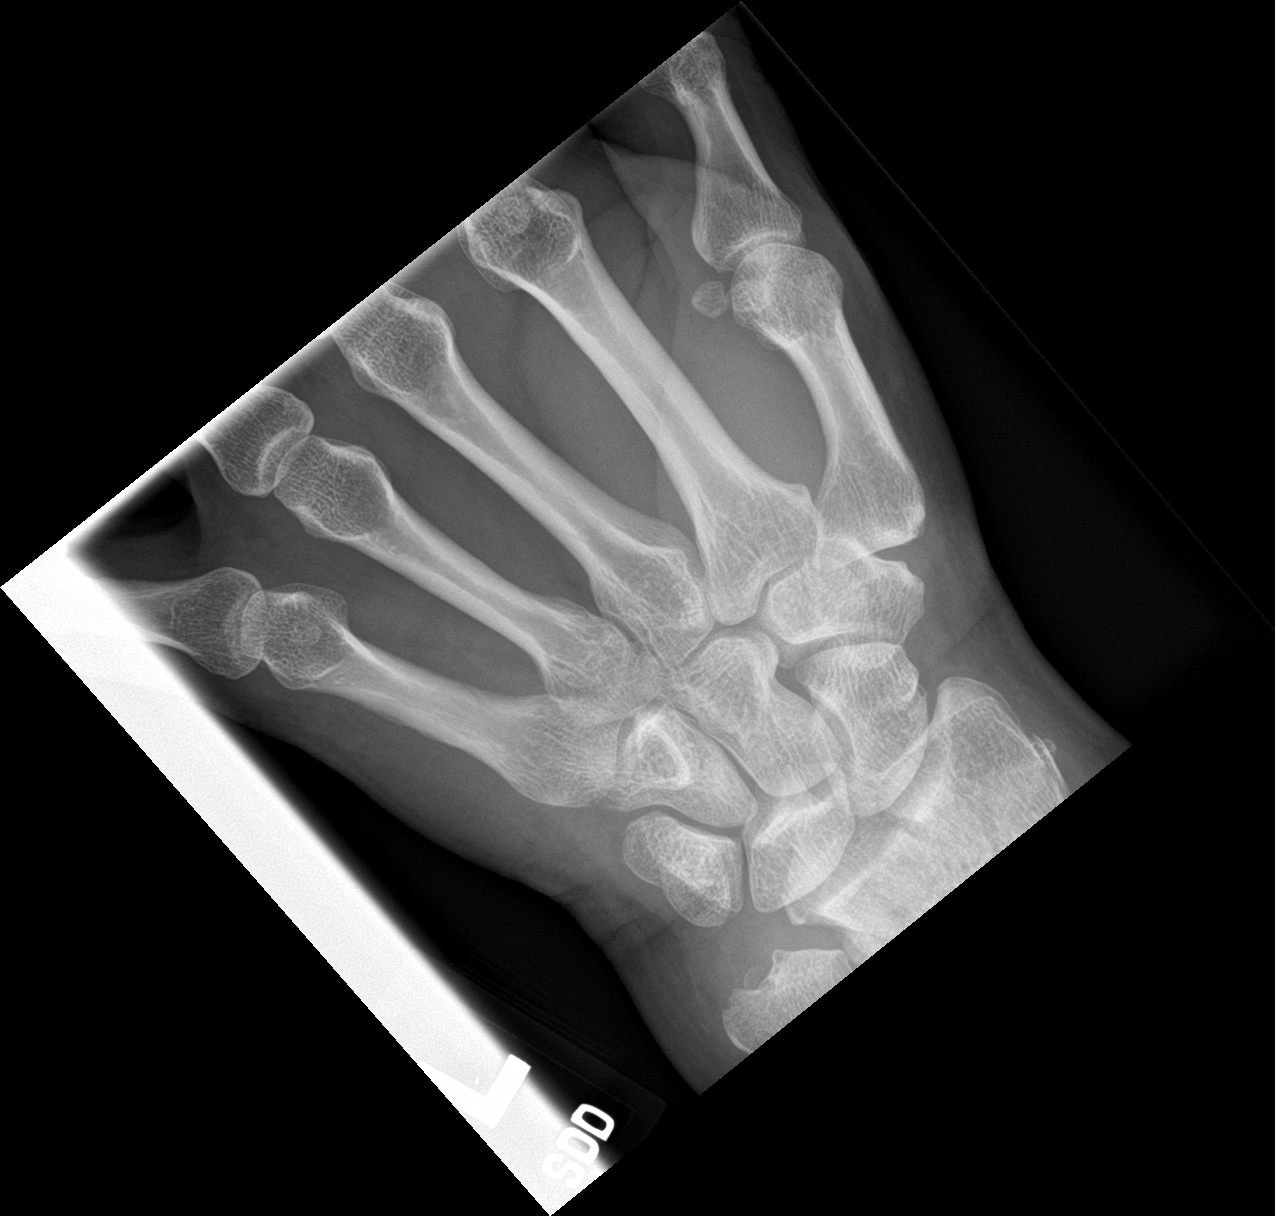

[4 of 4 positions shown; findings below may reference images not displayed]

FINDINGS: Acute displaced ulnar styloid process fracture. Acute markedly
comminuted intra-articular distal radius fracture with moderate
dorsal angulation of distal fracture fragment and close to [DATE] shaft
diameter dorsal displacement. Positive for soft tissue swelling
IMPRESSION: 1. Acute markedly comminuted intra-articular distal radius fracture
with dorsal angulation and displacement.
2. Acute displaced ulnar styloid process fracture.

## 2022-04-04 IMAGING — DX DG WRIST COMPLETE 3+V*L*
3 series · 3 of 3 positions shown · non-contrast
Comparison: [DATE] p.m.

CLINICAL DATA: Left wrist fracture, post reduction imaging

EXAM:
LEFT WRIST - COMPLETE 3+ VIEW

[wrist ap]
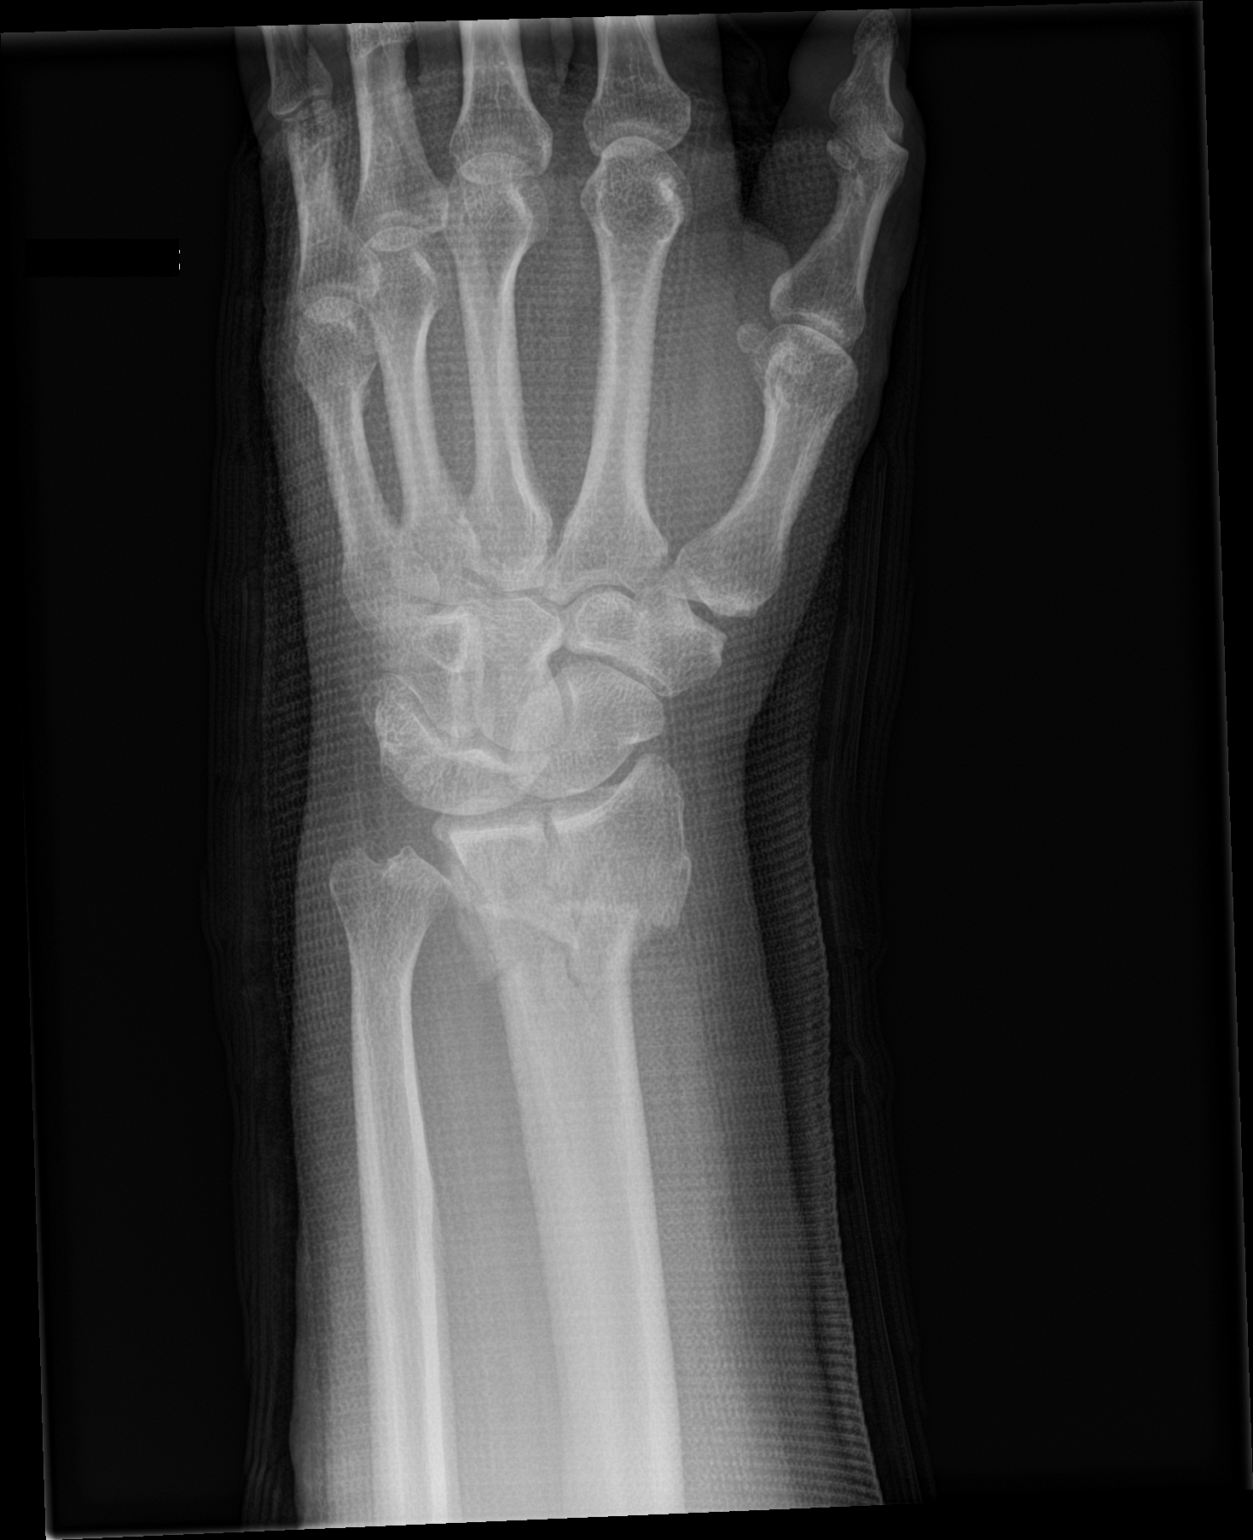

[wrist obl]
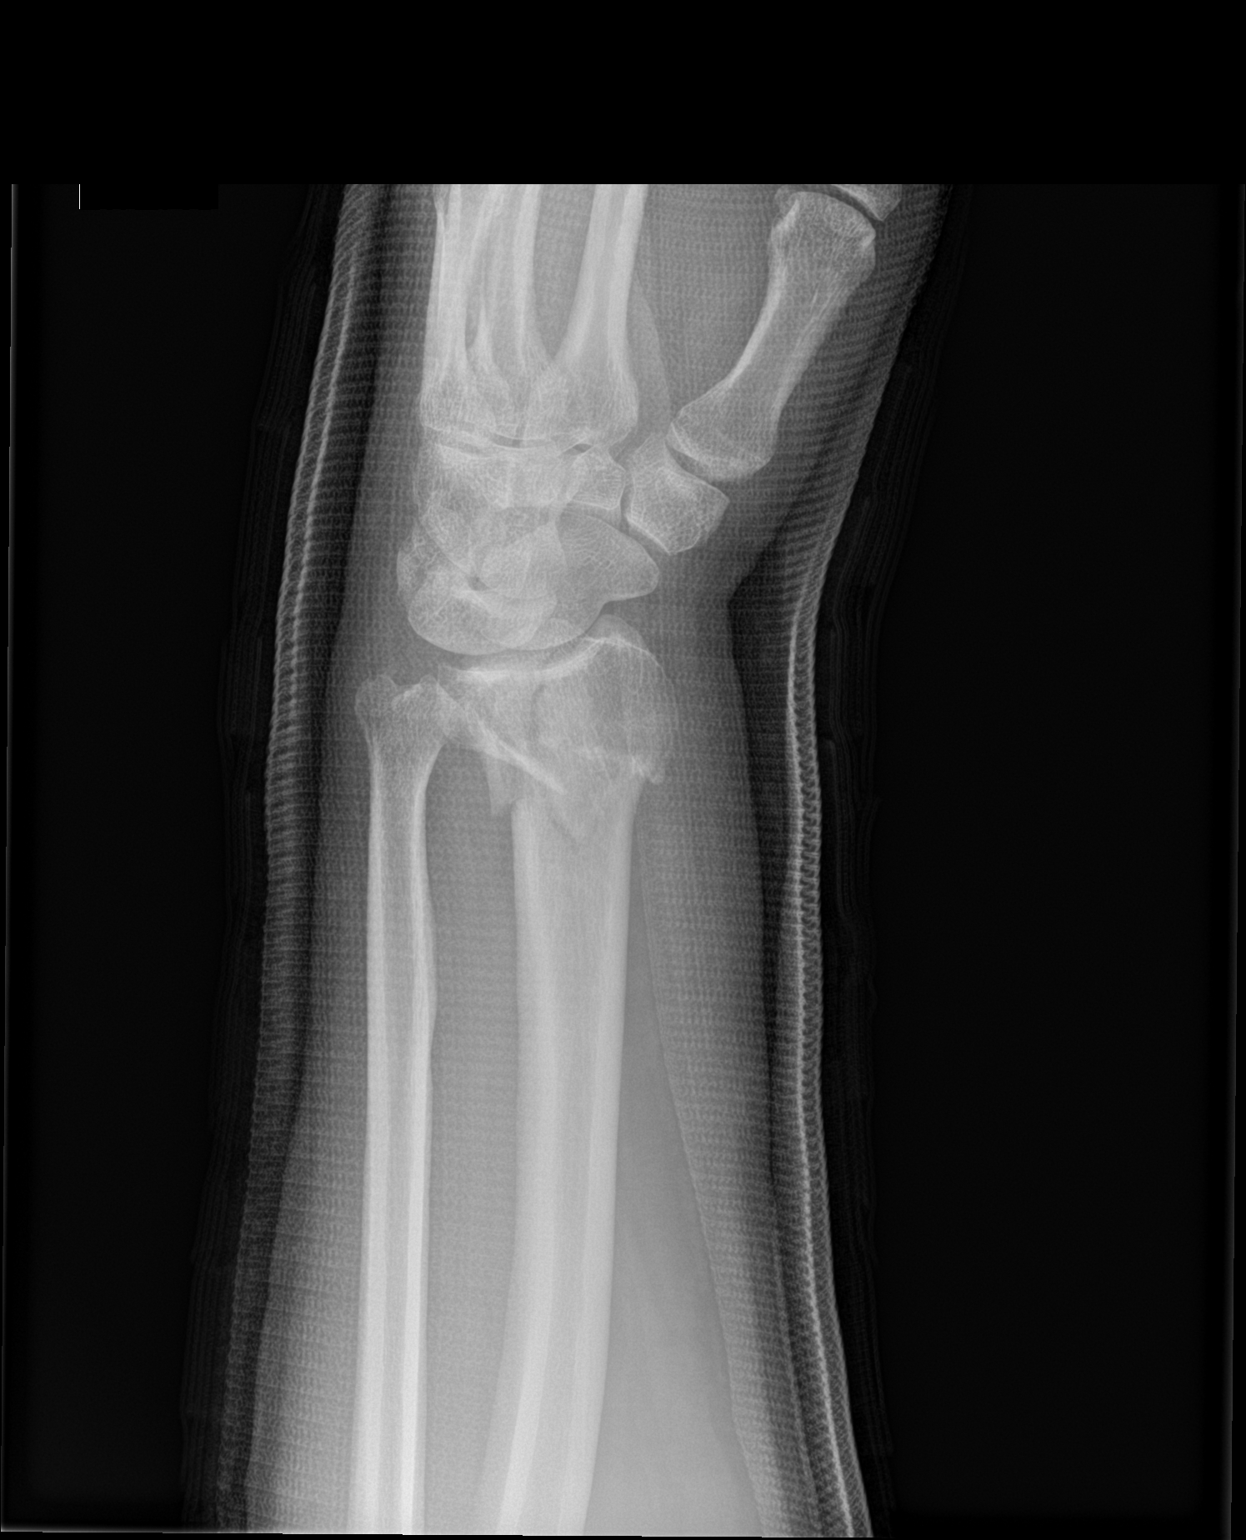

[wrist lat]
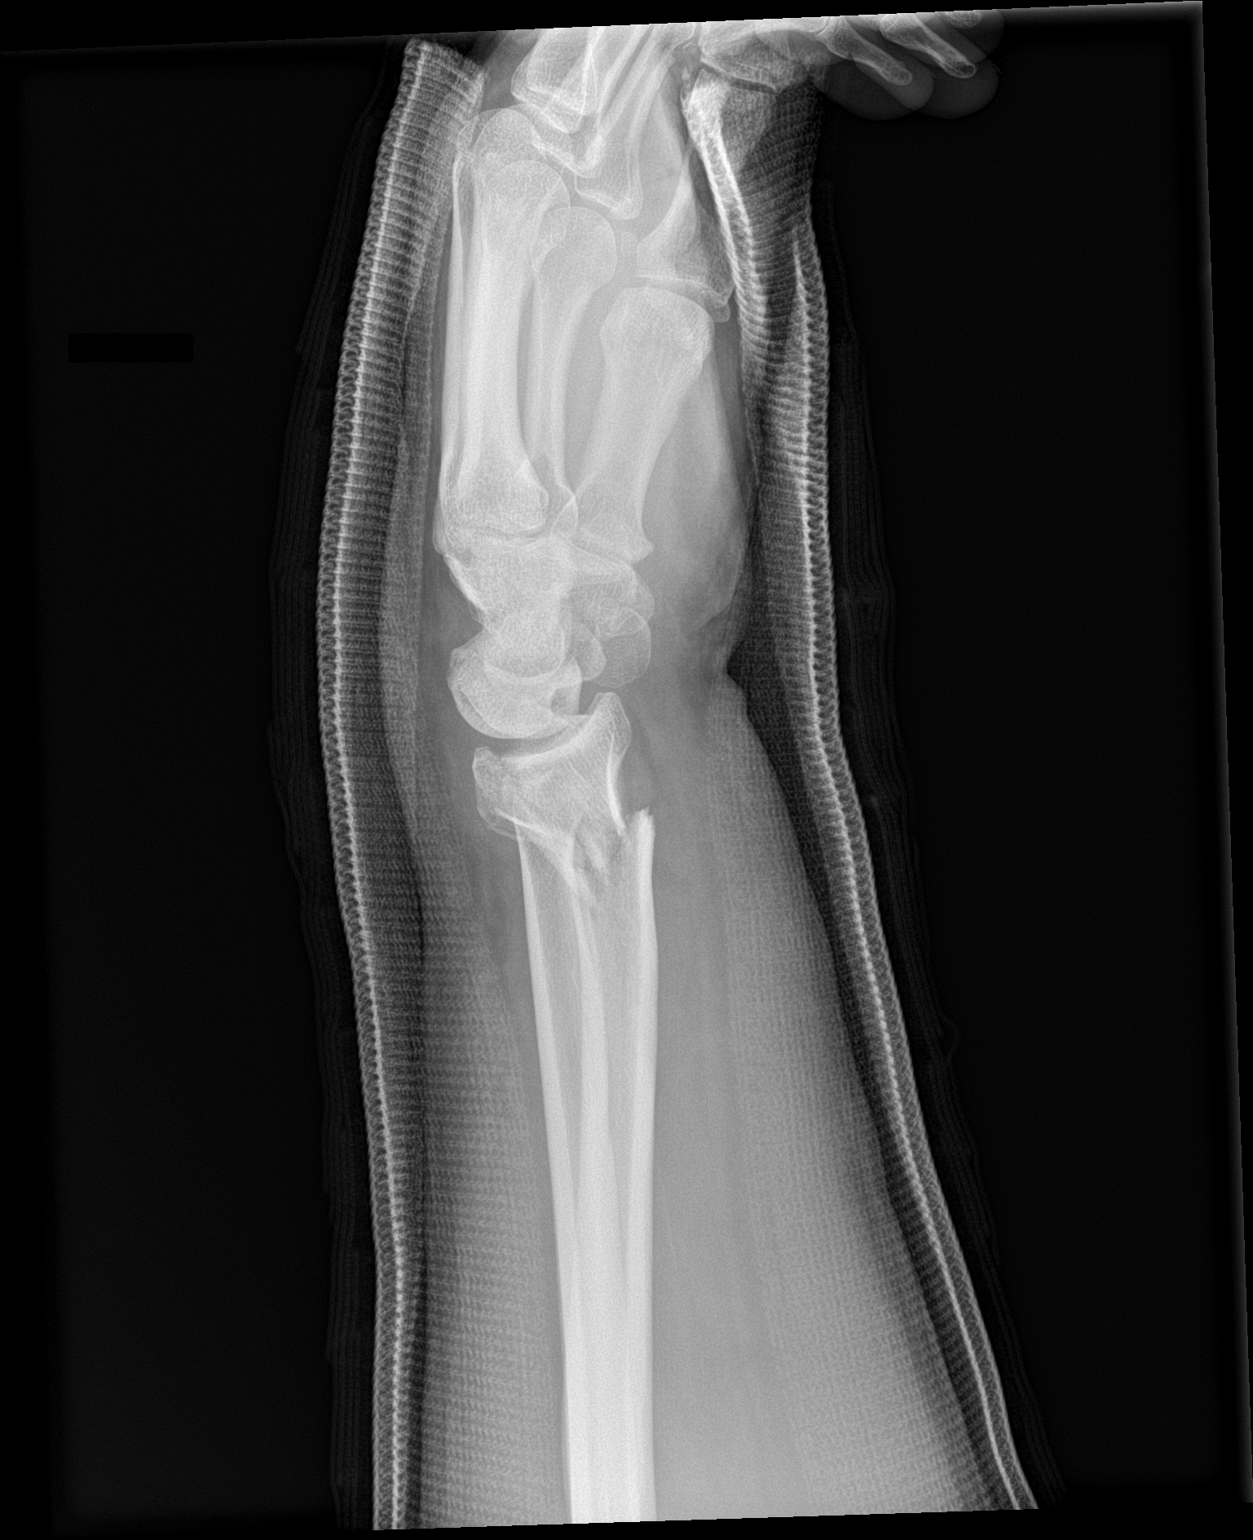

[3 of 3 positions shown; findings below may reference images not displayed]

FINDINGS: A a comminuted fracture of the distal radius is again identified.
Dominant fracture plane is seen transversely within the
metadiaphyseal region with persistentl roughly 6-7 mm override and
moderate dorsal angulation of the distal fracture fragment.
Longitudinal fracture planes extend into the scaphoid fossa with
residual 2 mm gap within the radial articular surface. Unchanged
mild loss of radial inclination. Radiocarpal articulation has been
preserved.
IMPRESSION: Interval reduction of comminuted left wrist fracture as described
above.

## 2022-04-28 ENCOUNTER — Other Ambulatory Visit: Payer: Self-pay

## 2022-08-29 ENCOUNTER — Other Ambulatory Visit: Payer: Self-pay

## 2022-08-29 MED ORDER — SERTRALINE HCL 100 MG PO TABS
100.0000 mg | ORAL_TABLET | Freq: Every day | ORAL | 3 refills | Status: DC
  Filled 2022-08-29: qty 90, 90d supply, fill #0
  Filled 2022-12-11: qty 90, 90d supply, fill #1
  Filled 2023-03-13: qty 90, 90d supply, fill #2
  Filled 2023-06-19: qty 90, 90d supply, fill #3

## 2022-10-20 ENCOUNTER — Other Ambulatory Visit: Payer: Self-pay

## 2022-10-20 MED ORDER — PRAVASTATIN SODIUM 40 MG PO TABS
40.0000 mg | ORAL_TABLET | Freq: Every day | ORAL | 3 refills | Status: AC
  Filled 2022-10-20: qty 90, 90d supply, fill #0

## 2022-11-01 ENCOUNTER — Ambulatory Visit: Payer: BC Managed Care – PPO | Admitting: Dermatology

## 2022-11-01 VITALS — BP 121/77

## 2022-11-01 DIAGNOSIS — L578 Other skin changes due to chronic exposure to nonionizing radiation: Secondary | ICD-10-CM | POA: Diagnosis not present

## 2022-11-01 DIAGNOSIS — L729 Follicular cyst of the skin and subcutaneous tissue, unspecified: Secondary | ICD-10-CM

## 2022-11-01 DIAGNOSIS — L821 Other seborrheic keratosis: Secondary | ICD-10-CM

## 2022-11-01 DIAGNOSIS — L814 Other melanin hyperpigmentation: Secondary | ICD-10-CM

## 2022-11-01 DIAGNOSIS — D1801 Hemangioma of skin and subcutaneous tissue: Secondary | ICD-10-CM

## 2022-11-01 DIAGNOSIS — L720 Epidermal cyst: Secondary | ICD-10-CM

## 2022-11-01 DIAGNOSIS — D229 Melanocytic nevi, unspecified: Secondary | ICD-10-CM

## 2022-11-01 DIAGNOSIS — W908XXA Exposure to other nonionizing radiation, initial encounter: Secondary | ICD-10-CM

## 2022-11-01 DIAGNOSIS — Z1283 Encounter for screening for malignant neoplasm of skin: Secondary | ICD-10-CM | POA: Diagnosis not present

## 2022-11-01 DIAGNOSIS — Z86018 Personal history of other benign neoplasm: Secondary | ICD-10-CM

## 2022-11-01 NOTE — Progress Notes (Signed)
   Follow-Up Visit   Subjective  Tyler Kennedy is a 47 y.o. male who presents for the following: Skin Cancer Screening and Full Body Skin Exam, hx of Dysplastic nevi  The patient presents for Total-Body Skin Exam (TBSE) for skin cancer screening and mole check. The patient has spots, moles and lesions to be evaluated, some may be new or changing and the patient may have concern these could be cancer.    The following portions of the chart were reviewed this encounter and updated as appropriate: medications, allergies, medical history  Review of Systems:  No other skin or systemic complaints except as noted in HPI or Assessment and Plan.  Objective  Well appearing patient in no apparent distress; mood and affect are within normal limits.  A full examination was performed including scalp, head, eyes, ears, nose, lips, neck, chest, axillae, abdomen, back, buttocks, bilateral upper extremities, bilateral lower extremities, hands, feet, fingers, toes, fingernails, and toenails. All findings within normal limits unless otherwise noted below.   Relevant physical exam findings are noted in the Assessment and Plan.    Assessment & Plan   SKIN CANCER SCREENING PERFORMED TODAY.  ACTINIC DAMAGE - Chronic condition, secondary to cumulative UV/sun exposure - diffuse scaly erythematous macules with underlying dyspigmentation - Recommend daily broad spectrum sunscreen SPF 30+ to sun-exposed areas, reapply every 2 hours as needed.  - Staying in the shade or wearing long sleeves, sun glasses (UVA+UVB protection) and wide brim hats (4-inch brim around the entire circumference of the hat) are also recommended for sun protection.  - Call for new or changing lesions.  LENTIGINES, SEBORRHEIC KERATOSES, HEMANGIOMAS - Benign normal skin lesions - Benign-appearing - Call for any changes  MELANOCYTIC NEVI - Tan-brown and/or pink-flesh-colored symmetric macules and papules - Benign appearing on exam  today - Observation - Call clinic for new or changing moles - Recommend daily use of broad spectrum spf 30+ sunscreen to sun-exposed areas.   HISTORY OF DYSPLASTIC NEVUS No evidence of recurrence today Recommend regular full body skin exams Recommend daily broad spectrum sunscreen SPF 30+ to sun-exposed areas, reapply every 2 hours as needed.  Call if any new or changing lesions are noted between office visits  - L scapula, R mid back lat 11.0cm lat to spine, R low back lat flank, 15.0cm lat to spine  Milia face - tiny firm white papules - type of cyst - benign - sometimes these will clear with nightly OTC adapalene/Differin 0.1% gel or retinol. - may be extracted if symptomatic - observe   Return in about 1 year (around 11/01/2023) for TBSE, Hx of Dysplastic nevi.  I, Ardis Rowan, RMA, am acting as scribe for Armida Sans, MD .   Documentation: I have reviewed the above documentation for accuracy and completeness, and I agree with the above.  Armida Sans, MD

## 2022-11-01 NOTE — Patient Instructions (Signed)

## 2022-11-12 ENCOUNTER — Encounter: Payer: Self-pay | Admitting: Dermatology

## 2023-01-24 ENCOUNTER — Other Ambulatory Visit: Payer: Self-pay

## 2023-01-24 MED ORDER — PRAVASTATIN SODIUM 40 MG PO TABS
40.0000 mg | ORAL_TABLET | Freq: Every day | ORAL | 1 refills | Status: DC
  Filled 2023-01-24: qty 90, 90d supply, fill #0
  Filled 2023-04-30: qty 90, 90d supply, fill #1

## 2023-03-13 ENCOUNTER — Other Ambulatory Visit: Payer: Self-pay

## 2023-03-13 MED ORDER — PROPRANOLOL HCL 20 MG PO TABS
20.0000 mg | ORAL_TABLET | Freq: Two times a day (BID) | ORAL | 3 refills | Status: AC
  Filled 2023-03-13: qty 180, 90d supply, fill #0
  Filled 2023-09-21: qty 180, 90d supply, fill #1
  Filled 2023-12-19: qty 180, 90d supply, fill #2

## 2023-06-19 ENCOUNTER — Other Ambulatory Visit: Payer: Self-pay

## 2023-07-31 ENCOUNTER — Other Ambulatory Visit: Payer: Self-pay

## 2023-07-31 MED ORDER — PRAVASTATIN SODIUM 40 MG PO TABS
40.0000 mg | ORAL_TABLET | Freq: Every day | ORAL | 1 refills | Status: DC
  Filled 2023-07-31: qty 90, 90d supply, fill #0
  Filled 2023-11-05: qty 90, 90d supply, fill #1

## 2023-08-01 ENCOUNTER — Other Ambulatory Visit: Payer: Self-pay

## 2023-09-21 ENCOUNTER — Other Ambulatory Visit: Payer: Self-pay

## 2023-09-21 MED ORDER — SERTRALINE HCL 100 MG PO TABS
100.0000 mg | ORAL_TABLET | Freq: Every day | ORAL | 3 refills | Status: AC
  Filled 2023-09-21: qty 90, 90d supply, fill #0
  Filled 2023-12-19: qty 90, 90d supply, fill #1

## 2023-09-25 ENCOUNTER — Other Ambulatory Visit: Payer: Self-pay

## 2023-09-25 MED ORDER — NEOMYCIN-POLYMYXIN-HC 1 % OT SOLN
3.0000 [drp] | Freq: Four times a day (QID) | OTIC | 0 refills | Status: AC
  Filled 2023-09-25: qty 10, 17d supply, fill #0

## 2023-12-19 ENCOUNTER — Other Ambulatory Visit: Payer: Self-pay

## 2023-12-20 ENCOUNTER — Other Ambulatory Visit: Payer: Self-pay

## 2024-02-07 ENCOUNTER — Other Ambulatory Visit: Payer: Self-pay

## 2024-02-07 MED ORDER — PRAVASTATIN SODIUM 40 MG PO TABS
40.0000 mg | ORAL_TABLET | Freq: Every day | ORAL | 1 refills | Status: AC
  Filled 2024-02-07: qty 90, 90d supply, fill #0

## 2024-10-30 ENCOUNTER — Ambulatory Visit: Payer: BC Managed Care – PPO | Admitting: Dermatology
